# Patient Record
Sex: Male | Born: 1965 | Hispanic: Refuse to answer | Smoking: Former smoker
Health system: Southern US, Community
[De-identification: ages and names within clinical notes are randomized; demographics above are authoritative.]

## PROBLEM LIST (undated history)

## (undated) DIAGNOSIS — F419 Anxiety disorder, unspecified: Secondary | ICD-10-CM

## (undated) DIAGNOSIS — F32A Depression, unspecified: Secondary | ICD-10-CM

## (undated) DIAGNOSIS — K219 Gastro-esophageal reflux disease without esophagitis: Secondary | ICD-10-CM

## (undated) DIAGNOSIS — K635 Polyp of colon: Secondary | ICD-10-CM

## (undated) DIAGNOSIS — F329 Major depressive disorder, single episode, unspecified: Secondary | ICD-10-CM

## (undated) DIAGNOSIS — F1020 Alcohol dependence, uncomplicated: Secondary | ICD-10-CM

## (undated) DIAGNOSIS — I10 Essential (primary) hypertension: Secondary | ICD-10-CM

## (undated) DIAGNOSIS — R188 Other ascites: Secondary | ICD-10-CM

## (undated) DIAGNOSIS — K7460 Unspecified cirrhosis of liver: Secondary | ICD-10-CM

## (undated) HISTORY — DX: Polyp of colon: K63.5

## (undated) HISTORY — DX: Gastro-esophageal reflux disease without esophagitis: K21.9

## (undated) HISTORY — DX: Other ascites: R18.8

## (undated) HISTORY — DX: Alcohol dependence, uncomplicated: F10.20

## (undated) HISTORY — PX: WISDOM TOOTH EXTRACTION: SHX21

## (undated) HISTORY — DX: Unspecified cirrhosis of liver: K74.60

---

## 2001-01-27 ENCOUNTER — Other Ambulatory Visit: Admission: RE | Admit: 2001-01-27 | Discharge: 2001-01-27 | Payer: Self-pay | Admitting: General Surgery

## 2010-04-03 ENCOUNTER — Ambulatory Visit (HOSPITAL_COMMUNITY): Payer: Self-pay | Admitting: Psychiatry

## 2012-06-05 ENCOUNTER — Inpatient Hospital Stay (HOSPITAL_COMMUNITY)
Admission: AD | Admit: 2012-06-05 | Discharge: 2012-06-09 | DRG: 897 | Disposition: A | Discharge status: Home / Self Care | Payer: 59 | Source: Ambulatory Visit | Attending: Psychiatry | Admitting: Psychiatry

## 2012-06-05 ENCOUNTER — Encounter (HOSPITAL_COMMUNITY): Payer: Self-pay | Admitting: *Deleted

## 2012-06-05 ENCOUNTER — Emergency Department (HOSPITAL_COMMUNITY)
Admission: EM | Admit: 2012-06-05 | Discharge: 2012-06-05 | Disposition: A | Discharge status: Other Acute Inpatient Hospital | Payer: Managed Care, Other (non HMO) | Attending: Emergency Medicine | Admitting: Emergency Medicine

## 2012-06-05 DIAGNOSIS — F17200 Nicotine dependence, unspecified, uncomplicated: Secondary | ICD-10-CM | POA: Insufficient documentation

## 2012-06-05 DIAGNOSIS — F3289 Other specified depressive episodes: Secondary | ICD-10-CM | POA: Insufficient documentation

## 2012-06-05 DIAGNOSIS — F32A Depression, unspecified: Secondary | ICD-10-CM

## 2012-06-05 DIAGNOSIS — F1010 Alcohol abuse, uncomplicated: Secondary | ICD-10-CM | POA: Diagnosis present

## 2012-06-05 DIAGNOSIS — R45850 Homicidal ideations: Secondary | ICD-10-CM | POA: Insufficient documentation

## 2012-06-05 DIAGNOSIS — I10 Essential (primary) hypertension: Secondary | ICD-10-CM | POA: Diagnosis present

## 2012-06-05 DIAGNOSIS — R45851 Suicidal ideations: Secondary | ICD-10-CM | POA: Insufficient documentation

## 2012-06-05 DIAGNOSIS — F411 Generalized anxiety disorder: Secondary | ICD-10-CM | POA: Insufficient documentation

## 2012-06-05 DIAGNOSIS — F1410 Cocaine abuse, uncomplicated: Secondary | ICD-10-CM | POA: Diagnosis present

## 2012-06-05 DIAGNOSIS — F1020 Alcohol dependence, uncomplicated: Principal | ICD-10-CM | POA: Diagnosis present

## 2012-06-05 DIAGNOSIS — F329 Major depressive disorder, single episode, unspecified: Secondary | ICD-10-CM | POA: Insufficient documentation

## 2012-06-05 DIAGNOSIS — F1310 Sedative, hypnotic or anxiolytic abuse, uncomplicated: Secondary | ICD-10-CM | POA: Diagnosis present

## 2012-06-05 HISTORY — DX: Depression, unspecified: F32.A

## 2012-06-05 HISTORY — DX: Anxiety disorder, unspecified: F41.9

## 2012-06-05 HISTORY — DX: Essential (primary) hypertension: I10

## 2012-06-05 HISTORY — DX: Major depressive disorder, single episode, unspecified: F32.9

## 2012-06-05 LAB — RAPID URINE DRUG SCREEN, HOSP PERFORMED
Amphetamines: NOT DETECTED
Barbiturates: NOT DETECTED
Benzodiazepines: NOT DETECTED
Cocaine: POSITIVE — AB
Opiates: NOT DETECTED
Tetrahydrocannabinol: NOT DETECTED

## 2012-06-05 LAB — COMPREHENSIVE METABOLIC PANEL
ALT: 31 U/L (ref 0–53)
AST: 31 U/L (ref 0–37)
Albumin: 4.1 g/dL (ref 3.5–5.2)
Alkaline Phosphatase: 74 U/L (ref 39–117)
BUN: 4 mg/dL — ABNORMAL LOW (ref 6–23)
CO2: 24 mEq/L (ref 19–32)
Calcium: 9.1 mg/dL (ref 8.4–10.5)
Chloride: 102 mEq/L (ref 96–112)
Creatinine, Ser: 0.93 mg/dL (ref 0.50–1.35)
GFR calc Af Amer: >90 mL/min (ref >90)
GFR calc non Af Amer: >90 mL/min (ref >90)
Glucose, Bld: 114 mg/dL — ABNORMAL HIGH (ref 70–99)
Potassium: 3.8 mEq/L (ref 3.5–5.1)
Sodium: 139 mEq/L (ref 135–145)
Total Bilirubin: 0.4 mg/dL (ref 0.3–1.2)
Total Protein: 7.4 g/dL (ref 6.0–8.3)

## 2012-06-05 LAB — CBC
HCT: 53 % — ABNORMAL HIGH (ref 39.0–52.0)
Hemoglobin: 20 g/dL — ABNORMAL HIGH (ref 13.0–17.0)
MCH: 33.8 pg (ref 26.0–34.0)
MCHC: 37.7 g/dL — ABNORMAL HIGH (ref 30.0–36.0)
MCV: 89.7 fL (ref 78.0–100.0)
Platelets: 169 10*3/uL (ref 150–400)
RBC: 5.91 MIL/uL — ABNORMAL HIGH (ref 4.22–5.81)
RDW: 12.9 % (ref 11.5–15.5)
WBC: 7.1 10*3/uL (ref 4.0–10.5)

## 2012-06-05 LAB — URINALYSIS, ROUTINE W REFLEX MICROSCOPIC
Bilirubin Urine: NEGATIVE
Glucose, UA: NEGATIVE mg/dL
Hgb urine dipstick: NEGATIVE
Ketones, ur: NEGATIVE mg/dL
Leukocytes, UA: NEGATIVE
Nitrite: NEGATIVE
Protein, ur: NEGATIVE mg/dL
Specific Gravity, Urine: 1.005 (ref 1.005–1.030)
Urobilinogen, UA: 0.2 mg/dL (ref 0.0–1.0)
pH: 6 (ref 5.0–8.0)

## 2012-06-05 LAB — ETHANOL
Alcohol, Ethyl (B): 231 mg/dL — ABNORMAL HIGH (ref 0–11)
Alcohol, Ethyl (B): 77 mg/dL — ABNORMAL HIGH (ref 0–11)

## 2012-06-05 MED ORDER — VITAMIN B-1 100 MG PO TABS: 100.0000 mg | ORAL_TABLET | Freq: Every day | ORAL | Status: DC

## 2012-06-05 MED ORDER — CHLORDIAZEPOXIDE HCL 25 MG PO CAPS
25.0000 mg | ORAL_CAPSULE | Freq: Three times a day (TID) | ORAL | Status: AC
  Administered 2012-06-07 – 2012-06-08 (×4): 25 mg via ORAL
  Filled 2012-06-05 (×2): qty 1

## 2012-06-05 MED ORDER — LORAZEPAM 2 MG/ML IJ SOLN: 1.0000 mg | Freq: Four times a day (QID) | INTRAMUSCULAR | Status: DC | PRN

## 2012-06-05 MED ORDER — ONDANSETRON HCL 4 MG PO TABS: 4.0000 mg | ORAL_TABLET | Freq: Three times a day (TID) | ORAL | Status: DC | PRN

## 2012-06-05 MED ORDER — ACETAMINOPHEN 325 MG PO TABS: 650.0000 mg | ORAL_TABLET | Freq: Four times a day (QID) | ORAL | Status: DC | PRN

## 2012-06-05 MED ORDER — LORAZEPAM 1 MG PO TABS
2.0000 mg | ORAL_TABLET | Freq: Once | ORAL | Status: AC
  Administered 2012-06-05: 2 mg via ORAL
  Filled 2012-06-05: qty 2

## 2012-06-05 MED ORDER — ADULT MULTIVITAMIN W/MINERALS CH
1.0000 | ORAL_TABLET | Freq: Every day | ORAL | Status: DC
  Administered 2012-06-06 – 2012-06-09 (×4): 1 via ORAL
  Filled 2012-06-05 (×5): qty 1

## 2012-06-05 MED ORDER — CHLORDIAZEPOXIDE HCL 25 MG PO CAPS
25.0000 mg | ORAL_CAPSULE | ORAL | Status: DC
  Administered 2012-06-08: 25 mg via ORAL
  Filled 2012-06-05: qty 1

## 2012-06-05 MED ORDER — ALUM & MAG HYDROXIDE-SIMETH 200-200-20 MG/5ML PO SUSP: 30.0000 mL | ORAL | Status: DC | PRN

## 2012-06-05 MED ORDER — LORAZEPAM 1 MG PO TABS: 1.0000 mg | ORAL_TABLET | Freq: Four times a day (QID) | ORAL | Status: DC | PRN

## 2012-06-05 MED ORDER — ADULT MULTIVITAMIN W/MINERALS CH
1.0000 | ORAL_TABLET | Freq: Every day | ORAL | Status: DC
  Administered 2012-06-05: 1 via ORAL
  Filled 2012-06-05: qty 1

## 2012-06-05 MED ORDER — NICOTINE 21 MG/24HR TD PT24
21.0000 mg | MEDICATED_PATCH | Freq: Every day | TRANSDERMAL | Status: DC
  Administered 2012-06-05: 21 mg via TRANSDERMAL
  Filled 2012-06-05: qty 1

## 2012-06-05 MED ORDER — FOLIC ACID 1 MG PO TABS
1.0000 mg | ORAL_TABLET | Freq: Every day | ORAL | Status: DC
  Administered 2012-06-05: 1 mg via ORAL
  Filled 2012-06-05: qty 1

## 2012-06-05 MED ORDER — ATENOLOL 25 MG PO TABS
25.0000 mg | ORAL_TABLET | Freq: Every day | ORAL | Status: DC
  Administered 2012-06-05: 25 mg via ORAL
  Filled 2012-06-05: qty 1

## 2012-06-05 MED ORDER — THIAMINE HCL 100 MG/ML IJ SOLN: 100.0000 mg | Freq: Every day | INTRAMUSCULAR | Status: DC

## 2012-06-05 MED ORDER — VITAMIN B-1 100 MG PO TABS
100.0000 mg | ORAL_TABLET | Freq: Every day | ORAL | Status: DC
  Administered 2012-06-06 – 2012-06-09 (×4): 100 mg via ORAL
  Filled 2012-06-05 (×5): qty 1

## 2012-06-05 MED ORDER — IBUPROFEN 400 MG PO TABS: 600.0000 mg | ORAL_TABLET | Freq: Three times a day (TID) | ORAL | Status: DC | PRN

## 2012-06-05 MED ORDER — ADULT MULTIVITAMIN W/MINERALS CH: 1.0000 | ORAL_TABLET | Freq: Every day | ORAL | Status: DC

## 2012-06-05 MED ORDER — HYDROXYZINE HCL 25 MG PO TABS
25.0000 mg | ORAL_TABLET | Freq: Four times a day (QID) | ORAL | Status: DC | PRN
  Administered 2012-06-05 – 2012-06-07 (×3): 25 mg via ORAL

## 2012-06-05 MED ORDER — CHLORDIAZEPOXIDE HCL 25 MG PO CAPS
25.0000 mg | ORAL_CAPSULE | Freq: Four times a day (QID) | ORAL | Status: AC
  Administered 2012-06-05 – 2012-06-06 (×5): 25 mg via ORAL
  Filled 2012-06-05 (×5): qty 1

## 2012-06-05 MED ORDER — CHLORDIAZEPOXIDE HCL 25 MG PO CAPS: 25.0000 mg | ORAL_CAPSULE | Freq: Every day | ORAL | Status: DC

## 2012-06-05 MED ORDER — LOPERAMIDE HCL 2 MG PO CAPS: 2.0000 mg | ORAL_CAPSULE | ORAL | Status: AC | PRN

## 2012-06-05 MED ORDER — NICOTINE 21 MG/24HR TD PT24
21.0000 mg | MEDICATED_PATCH | Freq: Every day | TRANSDERMAL | Status: DC
  Administered 2012-06-06 – 2012-06-08 (×3): 21 mg via TRANSDERMAL
  Filled 2012-06-05 (×6): qty 1

## 2012-06-05 MED ORDER — ONDANSETRON 4 MG PO TBDP: 4.0000 mg | ORAL_TABLET | Freq: Four times a day (QID) | ORAL | Status: AC | PRN

## 2012-06-05 MED ORDER — THIAMINE HCL 100 MG/ML IJ SOLN
100.0000 mg | Freq: Once | INTRAMUSCULAR | Status: AC
  Administered 2012-06-05: 100 mg via INTRAMUSCULAR

## 2012-06-05 MED ORDER — CHLORDIAZEPOXIDE HCL 25 MG PO CAPS
25.0000 mg | ORAL_CAPSULE | Freq: Four times a day (QID) | ORAL | Status: DC | PRN
  Administered 2012-06-07: 25 mg via ORAL
  Filled 2012-06-05: qty 1

## 2012-06-05 MED ORDER — VITAMIN B-1 100 MG PO TABS
100.0000 mg | ORAL_TABLET | Freq: Every day | ORAL | Status: DC
  Administered 2012-06-05: 100 mg via ORAL
  Filled 2012-06-05: qty 1

## 2012-06-05 MED ORDER — ZOLPIDEM TARTRATE 5 MG PO TABS: 5.0000 mg | ORAL_TABLET | Freq: Every evening | ORAL | Status: DC | PRN

## 2012-06-05 MED ORDER — MAGNESIUM HYDROXIDE 400 MG/5ML PO SUSP: 30.0000 mL | Freq: Every day | ORAL | Status: DC | PRN

## 2012-06-05 MED ORDER — TRAZODONE HCL 100 MG PO TABS
100.0000 mg | ORAL_TABLET | Freq: Once | ORAL | Status: AC
  Administered 2012-06-05: 100 mg via ORAL
  Filled 2012-06-05 (×2): qty 1

## 2012-06-05 MED ORDER — FOLIC ACID 1 MG PO TABS: 1.0000 mg | ORAL_TABLET | Freq: Every day | ORAL | Status: DC

## 2012-06-05 NOTE — ED Notes (Signed)
Report given to [facility] 

## 2012-06-05 NOTE — ED Notes (Signed)
Pt has been accepted to [facility] bed 508-01

## 2012-06-05 NOTE — ED Notes (Signed)
Pt in scrubs, wanded and sitter at bedside. nad

## 2012-06-05 NOTE — ED Provider Notes (Signed)
History   This chart was scribed for Raeford Razor, MD by Toya Smothers. The patient was seen in room APA17/APA17. Patient's care was started at 0659.  CSN: [id number]  Arrival date & time 06/05/12  4098   None     Chief Complaint  Patient presents with  . V70.1   The history is provided by the patient. No language interpreter was used.    Adam Hartman is a 46 y.o. male with a h/o Alcoholism, Sleep apnea, Panic Attacks, and Depression who presents to the ED complaining of suicidal and homicidal ideations onset 3 days ago. Ideations represent a drastic change from baseline behavior. Homicidal ideation pertain to killing Pt's ex-girlfriend, though Pt is reluctant to elaborate. Pt recently lost his job and his father has died. Prior to arrival to the ED Pt's last drink was a few hours ago. Pt denotes panic attacks when not using alcohol, and reports a dependency on unprescribed Xanax when not drinking. Denies hallucinations and delusions, and is guarded in disclosure of present illness.   Past Medical History  Diagnosis Date  . Hypertension   . Anxiety   . Depression     History reviewed. No pertinent past surgical history.  No family history on file.  History  Substance Use Topics  . Smoking status: Current Everyday Smoker    Types: Cigarettes  . Smokeless tobacco: Not on file  . Alcohol Use: 0.0 oz/week    18-19 Cans of beer per week     Review of Systems  Psychiatric/Behavioral: Positive for suicidal ideas and disturbed wake/sleep cycle. Negative for hallucinations.       Homicidal Ideations, Depression   All other systems reviewed and are negative.   Allergies  Codeine  Home Medications   Current Outpatient Rx  Name Route Sig Dispense Refill  . ATENOLOL 25 MG PO TABS Oral Take 25 mg by mouth daily.      BP 134/87  Pulse 76  Temp 97.9 F (36.6 C) (Oral)  Resp 18  Ht 6' (1.829 m)  Wt 240 lb (108.863 kg)  BMI 32.55 kg/m2  SpO2 100%  Physical Exam    Nursing note and vitals reviewed. Constitutional: He is oriented to person, place, and time. He appears well-developed and well-nourished. No distress.       Flat affect. Poor eye contact.  HENT:  Head: Atraumatic.  Mouth/Throat: No oropharyngeal exudate.  Eyes: EOM are normal. Pupils are equal, round, and reactive to light. Right eye exhibits no discharge. Left eye exhibits no discharge.  Neck: Neck supple.  Cardiovascular: Normal rate and regular rhythm.   No murmur heard. Pulmonary/Chest: Effort normal. No stridor. No respiratory distress. He has no rales. He exhibits no tenderness.       Slight wheezes bilaterally.  Abdominal: Soft. Bowel sounds are normal. He exhibits no mass. There is no tenderness. There is no rebound.  Musculoskeletal: He exhibits no tenderness.       Baseline ROM, moves extremities with no obvious new focal weakness.  Lymphadenopathy:    He has no cervical adenopathy.  Neurological: He is alert and oriented to person, place, and time. No cranial nerve deficit. Coordination normal.  Skin: No rash noted. He is not diaphoretic.  Psychiatric: He has a normal mood and affect.    ED Course  Procedures (including critical care time) DIAGNOSTIC STUDIES: Oxygen Saturation is 100% on room air, normal by my interpretation.    COORDINATION OF CARE: 0727- Evaluated Pt. Pt is with acute distress,  awake, alert, and oriented. 35- Ordered CBC STAT. 0981- Ordered ED EKG Once. 1914- Ordered Comprehensive metabolic panel STAT. [id number]- Ordered Ethanol STAT. [id number]- Ordered Urinalysis, Routine w reflex microscopic Once . 7829- Ordered Drug screen panel, emergency STAT. [id number] Ordered Full code Continuous. 3086- Ordered ondansetron (ZOFRAN) tablet 4 mg Every 8 hours PRN. 5784- Ordered zolpidem (AMBIEN) tablet 5 mg At bedtime PRN. 6962- Ordered ibuprofen (ADVIL,MOTRIN) tablet 600 mg Every 8 hours. 9528- Ordered LORazepam (ATIVAN) injection 1 mg Every 6 hours PRN. 4132-  Ordered LORazepam (ATIVAN) tablet 1 mg Every 6 hours PRN.  4401- Ordered Clinical institute withdrawal assessment every 6 hours X 48 hours, then every 12 hours x 48 hours Until discontinued. 38- Ordered Vital signs every 6 hours X 48 hours, then per unit protocol Until discontinued. 0272- Psychiatry Ordered.   Labs Reviewed  CBC - Abnormal; Notable for the following:    RBC 5.91 (*)     Hemoglobin 20.0 (*)     HCT 53.0 (*)     MCHC 37.7 (*)     All other components within normal limits  COMPREHENSIVE METABOLIC PANEL - Abnormal; Notable for the following:    Glucose, Bld 114 (*)     BUN 4 (*)     All other components within normal limits  ETHANOL - Abnormal; Notable for the following:    Alcohol, Ethyl (B) 231 (*)     All other components within normal limits  URINALYSIS, ROUTINE W REFLEX MICROSCOPIC  URINE RAPID DRUG SCREEN (HOSP PERFORMED)    No results found.  EKG:  Rhythm: normal sinus Vent. rate 68 BPM PR interval 184 ms QRS duration 92 ms QT/QTc 398/423 ms P-R-T axes 54 17 19 Intervals/conduction: normal ST segments: inverted t waves in III   1. Alcohol abuse   2. Depression   3. Suicidal ideation       MDM  46yM with depression, etoh abuse and SI. Pt voluntary. No evidence of psychosis. Discussed with ACT who will eval pt in ED.   I personally preformed the services scribed in my presence. The recorded information has been reviewed and considered. Raeford Razor, MD.      Raeford Razor, MD 06/05/12 1041
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
## 2012-06-05 NOTE — ED Notes (Signed)
Per Ala Dach at [facility] - pt has been accepted but no beds available today.  Possibly tomorrow.  Pt notified and verbalized understanding.  Sitter at bedside.
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
## 2012-06-05 NOTE — ED Notes (Signed)
SI x 3 days.  Reports loss of job and girlfriend.  Admits to plan but refuses to tell RN what plan would be.  Pt states, "Yes I've planned it out but if I wanted to do it I would have done it by now."  Denies hallucinations/delusions.  Admits drinking ETOH last night, reports drinking on a daily basis.  Admits to HI toward his girlfriend.  Calm, cooperative at this time.

## 2012-06-05 NOTE — ED Notes (Signed)
Per Patsy Lager with ACT - pt has been accepted to [facility] pending ETOH below 200.  Will call [facility] for bed assignment once lab results are back.

## 2012-06-05 NOTE — BHH Counselor (Signed)
[facility] authorization given by Nelida Meuse. For 3 days; Auth # [id number], 8/23-8/25, review on 06/08/12.

## 2012-06-05 NOTE — Progress Notes (Signed)
Psychoeducational Group Note  Date:  06/05/2012 Time:  2000  Group Topic/Focus:  Wrap-Up Group:   The focus of this group is to help patients review their daily goal of treatment and discuss progress on daily workbooks.  Participation Level: Did Not Attend  Participation Quality:  Not Applicable  Affect:  Not Applicable  Cognitive:  Not Applicable  Insight:  Not Applicable  Engagement in Group: Not Applicable  Additional Comments:  The pt. was admitted after the group was held.   Hazle Coca S 06/05/2012, 10:37 PM

## 2012-06-05 NOTE — BHH Counselor (Signed)
Ardelia Mems, ACT nurse at [facility], submitted Pt for admission to [facility]. Consulted with Theodoro Kos, [facility] who said an appropriate bed is not currently available. Dr. Harvie Heck Readling reviewed clinical information and accepted Pt to the services of Dr. Antonietta Breach when a 300 hall bed is available and when BAL<200.  Harlin Rain Patsy [place], LPC

## 2012-06-06 DIAGNOSIS — F1020 Alcohol dependence, uncomplicated: Secondary | ICD-10-CM

## 2012-06-06 DIAGNOSIS — F1010 Alcohol abuse, uncomplicated: Secondary | ICD-10-CM | POA: Diagnosis present

## 2012-06-06 HISTORY — DX: Alcohol dependence, uncomplicated: F10.20

## 2012-06-06 NOTE — Progress Notes (Signed)
BHH Group Notes:  (Counselor/Nursing/MHT/Case Management/Adjunct)  06/06/2012 10:27 AM  Type of Therapy:  After care Planning Group  Pt. participated in  After care planning group and was given [facility] Suicide Prevention information along with crisis numbers and agreed to use them if needed. The pt. Stated that he was okay and that he came to [facility] with problems with alcohol. Pt. Denied SI/Hi today. Pt. Stated he was transferred form [facility]  And has not seen a doctor yet. Pt. was told he would see a doctor today.  Lamar Blinks [place]dal 06/06/2012, 10:27 AM

## 2012-06-06 NOTE — Progress Notes (Signed)
[facility] Group Notes:  (Counselor/Nursing/MHT/Case Management/Adjunct)  06/06/2012 4:16 PM  Type of Therapy:  Group Therapy  Participation Level:  Active  Participation Quality:  Appropriate  Affect:  Appropriate  Cognitive:  Appropriate  Insight:  Good  Engagement in Group:  Good  Engagement in Therapy:  Good  Modes of Intervention:  Clarification, Education, Socialization and Support  Summary of Progress/Problems: The pt. participated in group discussion on self sabotaging behaviors and how to recognize when they are doing these behaviors and how the can stop and make the changes they need to make in order to stop doing self sabotaging behaviors again.  The pt. Spoke about his self sabotaging behavior being that when things are going good(used example of girlfriend), and how he will do things like start a fight so that the relationship can end. Pt. Made the statement that he knew when "exactly" he was doing some things that was self sabotaging to him.  Adam Hartman [place] 06/06/2012, 4:16 PM

## 2012-06-06 NOTE — Progress Notes (Signed)
Patient ID: Adam Hartman, male   DOB: 11-04-1965, 46 y.o.   MRN: [id number]  Problem: Depression, ETOH Abuse  D: Pt admitted to unit, endorses depression with dull, flat affect.  A: Monitor patient Q 15 minutes for safety, encourage staff/peer interaction, administer medications as ordered by MD.  R: Pt arrived too late to attend groups, but was compliant with HS medications. No inappropriate behaviors noted. Pt contracts for safety verbally.

## 2012-06-06 NOTE — BHH Suicide Risk Assessment (Signed)
Suicide Risk Assessment  Admission Assessment     Demographic factors:  Assessment Details Time of Assessment: Admission Information Obtained From: Patient Current Mental Status:  Current Mental Status: Suicidal ideation indicated by patient;Suicide plan Loss Factors:  Loss Factors: Loss of significant relationship;Financial problems / change in socioeconomic status Historical Factors:    Risk Reduction Factors:  Risk Reduction Factors: Positive social support  CLINICAL FACTORS:   Depression:   Anhedonia Hopelessness Impulsivity Insomnia Alcohol/Substance Abuse/Dependencies  COGNITIVE FEATURES THAT CONTRIBUTE TO RISK:  Loss of executive function Polarized thinking    SUICIDE RISK:  Minimal: No identifiable suicidal ideation.  Patients presenting with no risk factors but with morbid ruminations; may be classified as minimal risk based on the severity of the depressive symptoms  PLAN OF CARE: Patient was admitted to the [facility], adult psychiatric unit for treatment of depression, and alcohol detox treatment. Patient will be receiving Librium protocol and other medication as needed.   Douglass Dunshee,JANARDHAHA R. 06/06/2012, 5:54 PM

## 2012-06-06 NOTE — Progress Notes (Signed)
D) Pt has attended the program today and participates.Rates his depression at a 2 and his hopelessness at a 1. Denies SI and HI. States that he is thinking long and hard about changing his life. Had a good job and took [employer] for everything and ran out. He was not informed that he had run out of time and he was let go because he went over his limit. States that he had originally gotten FMLA for his father who is sick and has cancer "all over". States he spent very little of the FMLA on his father and would go to his cousins house and sit there and drink. In the last couple of months Pt had worked himself up to a 12 pack of beer daily. Also lost his girlfriend, whom he says he loves and wants to get her back. "I signed myself in here so I could stop drinking". A) Provided with a 1:1. Given support, reassurance and praise. Encouraged and educated to attend the 300 hall groups. AA explained to Pt so that he can get some numbers and names when he leaves here. R) Denies SI and HI. States he wants very much to get his life back.
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
## 2012-06-07 DIAGNOSIS — F1994 Other psychoactive substance use, unspecified with psychoactive substance-induced mood disorder: Secondary | ICD-10-CM

## 2012-06-07 DIAGNOSIS — F1010 Alcohol abuse, uncomplicated: Secondary | ICD-10-CM

## 2012-06-07 NOTE — Progress Notes (Signed)
Met with pt 1:1 who is somewhat guarded. He denies any w/d symptoms however is reddened in the face and sweating observed. BP also elevated. He denies any SI/HI/AVH. Anxious affect with congruent mood. Support given, medicated with scheduled librium and vistaril prn for sleep which was effective. Pt remains safe, currently asleep. Adam Hartman

## 2012-06-07 NOTE — Progress Notes (Signed)
Progress note was performed by physician extender and as a supervising MD, available for assistance near by during this rounds. Agree with the findings.   
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
## 2012-06-07 NOTE — H&P (Signed)
Progress note was performed by physician extender and as a supervising MD, available for assistance near by during this rounds.  

## 2012-06-07 NOTE — Progress Notes (Signed)
D.  Pt pleasant on approach, feeling some anxiety.  Positive for AA group this evening.  Requested medication for anxiety and to help him sleep.  No further complaints noted.  Denies SI/HI/hallucinations at this time.  A.  Support and encouragement offered.  Prn medication given as ordered and appropriate.  R.  No acute distress noted, will continue to monitor.

## 2012-06-07 NOTE — Progress Notes (Signed)
The patient attended the A. A. Meeting on the 300 hallway. 

## 2012-06-07 NOTE — Progress Notes (Signed)
[facility] MD Progress Note  06/07/2012 3:23 PM  S: "I am kind of depressed and worried. Feeling a lot of guilt. I do not need depression medicines. Trying to reflect on things. No suicidal thoughts or thoughts of hurting anyone else. The withdrawal symptoms, not so bad."  Diagnosis:   Axis I: Substance Induced Mood Disorder and Alcohol abuse, continuous Axis II: Deferred Axis III:  Past Medical History  Diagnosis Date  . Hypertension   . Anxiety   . Depression    Axis IV: other psychosocial or environmental problems Axis V: 51-60 moderate symptoms  ADL's:  Intact  Sleep: Good  Appetite:  Good  Suicidal Ideation: "No" Plan:  No Intent:  no Means:  no Homicidal Ideation:  Plan:  No Intent:  no Means:  no  AEB (as evidenced by): per patient's reports.  Mental Status Examination/Evaluation: Objective:  Appearance: Casual  Eye Contact::  Good  Speech:  Clear and Coherent  Volume:  Normal  Mood:  Depressed, guilt  Affect:  Flat  Thought Process:  Coherent  Orientation:  Full  Thought Content:  Rumination  Suicidal Thoughts:  No  Homicidal Thoughts:  No  Memory:  Immediate;   Good Recent;   Good Remote;   Good  Judgement:  Good  Insight:  Good  Psychomotor Activity:  Normal  Concentration:  Good  Recall:  Good  Akathisia:  No  Handed:  Right  AIMS (if indicated):     Assets:  Desire for Improvement  Sleep:  Number of Hours: 6.75    Vital Signs:Blood pressure 141/87, pulse 80, temperature 97.1 F (36.2 C), temperature source Oral, resp. rate 20, height 6' (1.829 m), weight 117.028 kg (258 lb). Current Medications: Current Facility-Administered Medications  Medication Dose Route Frequency Provider Last Rate Last Dose  . acetaminophen (TYLENOL) tablet 650 mg  650 mg Oral Q6H PRN Nehemiah Settle, MD      . alum & mag hydroxide-simeth (MAALOX/MYLANTA) 200-200-20 MG/5ML suspension 30 mL  30 mL Oral Q4H PRN Nehemiah Settle, MD      .  chlordiazePOXIDE (LIBRIUM) capsule 25 mg  25 mg Oral Q6H PRN Nehemiah Settle, MD      . chlordiazePOXIDE (LIBRIUM) capsule 25 mg  25 mg Oral QID Nehemiah Settle, MD   25 mg at 06/06/12 2134   Followed by  . chlordiazePOXIDE (LIBRIUM) capsule 25 mg  25 mg Oral TID Nehemiah Settle, MD   25 mg at 06/07/12 0940   Followed by  . chlordiazePOXIDE (LIBRIUM) capsule 25 mg  25 mg Oral BH-qamhs Nehemiah Settle, MD       Followed by  . chlordiazePOXIDE (LIBRIUM) capsule 25 mg  25 mg Oral Daily Nehemiah Settle, MD      . hydrOXYzine (ATARAX/VISTARIL) tablet 25 mg  25 mg Oral Q6H PRN Nehemiah Settle, MD   25 mg at 06/06/12 2134  . loperamide (IMODIUM) capsule 2-4 mg  2-4 mg Oral PRN Nehemiah Settle, MD      . magnesium hydroxide (MILK OF MAGNESIA) suspension 30 mL  30 mL Oral Daily PRN Nehemiah Settle, MD      . multivitamin with minerals tablet 1 tablet  1 tablet Oral Daily Nehemiah Settle, MD   1 tablet at 06/07/12 0939  . nicotine (NICODERM CQ - dosed in mg/24 hours) patch 21 mg  21 mg Transdermal Q0600 Nehemiah Settle, MD   21 mg at 06/07/12 0630  . ondansetron (ZOFRAN-ODT)  disintegrating tablet 4 mg  4 mg Oral Q6H PRN Nehemiah Settle, MD      . thiamine (VITAMIN B-1) tablet 100 mg  100 mg Oral Daily Nehemiah Settle, MD   100 mg at 06/07/12 1610    Lab Results: No results found for this or any previous visit (from the past 48 hour(s)).  Physical Findings: AIMS: Facial and Oral Movements Muscles of Facial Expression: None, normal Lips and Perioral Area: None, normal Jaw: None, normal Tongue: None, normal,Extremity Movements Upper (arms, wrists, hands, fingers): None, normal Lower (legs, knees, ankles, toes): None, normal, Trunk Movements Neck, shoulders, hips: None, normal, Overall Severity Severity of abnormal movements (highest score from questions above): None,  normal Incapacitation due to abnormal movements: None, normal Patient's awareness of abnormal movements (rate only patient's report): No Awareness, Dental Status Current problems with teeth and/or dentures?: No Does patient usually wear dentures?: No  CIWA:  CIWA-Ar Total: 5  COWS:  COWS Total Score: 0   Treatment Plan Summary: Daily contact with patient to assess and evaluate symptoms and progress in treatment Medication management  Plan: Continue to decline to take an antidepressant, although continue to report  Increased depression. Continue patient on detox protocol.  Armandina Stammer I 06/07/2012, 3:23 PM

## 2012-06-07 NOTE — Progress Notes (Signed)
D) Pt being transferred to the 300 Hall to attend and be part of the detox program.  Rates his depression and hopelessness 1. Denies SI and HI. Has slept much of the morning. Affect is flat and mood depressed. States that he is feelng better overall, but his words are incongruent with his affect and mood.  A) Given support and reassurance. Encouraged to attend the program on the 300 hall and not to sleep. R) Denies SI and HI.

## 2012-06-07 NOTE — Progress Notes (Signed)
Patient ID: Adam Hartman, male   DOB: [birth year], 46 y.o.   MRN: [id number]   [facility] Group Notes:  (Counselor/Nursing/MHT/Case Management/Adjunct)  06/07/2012 1:15 PM  Type of Therapy:  Group Therapy, Dance/Movement Therapy   Participation Level:  Did Not Attend.  Pt. Did not attend counseling group.    Gevena Mart 06/07/2012. 2:54 PM

## 2012-06-07 NOTE — Progress Notes (Signed)
Patient ID: Adam Hartman, male   DOB: 1966-05-29, 46 y.o.   MRN: [id number] D: Pt is awake in bed this AM. Pt denies SI/HI and A/V hallucinations. Pt is not participating in the milieu but is relatively cooperative with staff. Pt rates their depression at 1 and hopelessness at 1. Pt's most recent CIWA score was 5. However, pt mood is extremely depressed and he expresses shame related to not spending time with his father while he is in the hospital with cancer. Pt wants to be alone and is ruminating. Pt writes that he intends to attend AA and counseling after d/c.   A: Writer offered self, utilized therapeutic communication and administered medication per MD orders. Writer also encouraged pt to discuss feelings with staff and attend groups. Pt states that "it is really  getting to me today."   R: Pt is not attending groups but is tolerating medications well. Writer will continue to monitor. 15 minute checks are ongoing for safety.

## 2012-06-07 NOTE — Progress Notes (Signed)
Psychoeducational Group Note  Date:  06/07/2012 Time:  1515  Group Topic/Focus:  Making Healthy Choices:   The focus of this group is to help patients identify negative/unhealthy choices they were using prior to admission and identify positive/healthier coping strategies to replace them upon discharge.  Participation Level:  Did Not Attend  Dalia Heading 06/07/2012, 4:50 PM

## 2012-06-08 LAB — CBC
HCT: 50 % (ref 39.0–52.0)
Hemoglobin: 17.7 g/dL — ABNORMAL HIGH (ref 13.0–17.0)
MCV: 89.3 fL (ref 78.0–100.0)
RDW: 12.8 % (ref 11.5–15.5)
WBC: 7.5 10*3/uL (ref 4.0–10.5)

## 2012-06-08 MED ORDER — SERTRALINE HCL 50 MG PO TABS
ORAL_TABLET | ORAL | Status: AC
  Filled 2012-06-08: qty 1

## 2012-06-08 MED ORDER — TRAZODONE HCL 50 MG PO TABS: 50.0000 mg | ORAL_TABLET | Freq: Every evening | ORAL | Status: DC | PRN

## 2012-06-08 NOTE — Progress Notes (Signed)
Patient did attend the evening speaker AA meeting.  

## 2012-06-08 NOTE — Progress Notes (Signed)
[facility] Group Notes:  (Counselor/Nursing/MHT/Case Management/Adjunct)   Type of Therapy:  Group Therapy at 1:15 to 2:30 PM  Participation Level:  Did Not Attend  Adam Hartman 06/08/2012, 2:22 PM

## 2012-06-08 NOTE — Progress Notes (Signed)
Pt attended discharge planning group and actively participated in group.  SW provided pt with today's workbook.  Pt presents with calm mood and affect.  Pt was open with sharing reason for entering the hospital.  Pt states that he was drinking too much and it got out of hand.  Pt states that he came to detox and doesn't feel he needs further treatment after detox.  Pt is open to going to outpatient and AA meetings.  Pt states that he is worried about his dad, stating that he is in the hospital now dying.  Pt states that he plans to live with his mother after d/c and has transportation home.  SW will refer pt to [facility] for medication management and therapy.  SW will provide pt with a list of AA meetings in [place].  Pt denies having depression, anxiety and SI/HI.  No further needs voiced by pt at this time.    Adam Hartman, LCSWA 06/08/2012  1:41 PM

## 2012-06-08 NOTE — Progress Notes (Signed)
Psychoeducational Group Note  Date:  06/08/2012 Time:  1100  Group Topic/Focus:  Wellness Toolbox:   The focus of this group is to discuss various aspects of wellness, balancing those aspects and exploring ways to increase the ability to experience wellness.  Patients will create a wellness toolbox for use upon discharge.  Participation Level: Did Not Attend  Participation Quality:  Not Applicable  Affect:  Not Applicable  Cognitive:  Not Applicable  Insight:  Not Applicable  Engagement in Group: Not Applicable  Additional Comments:  Pt did not attend group but remained in room during group.   Koleson Reifsteck Gershon 06/08/2012, 1:26 PM 

## 2012-06-08 NOTE — Progress Notes (Signed)
Psychoeducational Group Note  Date:  06/08/2012 Time:  1000  Group Topic/Focus:  Therapeutic Activity: Human Bingo  Participation Level:  Active  Participation Quality:  Appropriate and Attentive  Affect:  Appropriate  Cognitive:  Appropriate  Insight:  Good  Engagement in Group:  Good  Additional Comments: Pt participated in group of Human Bingo. Pt was asked question about other patients and was given the opportunity to ask peers if it applied to them. An example may be "someone in the room wearing blue." Pt stated at the end of group, they learned information about peers they had no knowledge of before. Pt was able to interact with peers and share information that was therapeutic to learning facts about others.   Karleen Hampshire Brittini 06/08/2012, 11:10 AM

## 2012-06-08 NOTE — Progress Notes (Signed)
Psychoeducational Group Note  Date:  06/08/2012 Time: 2000  Group Topic/Focus:  Wrap-Up Group:   The focus of this group is to help patients review their daily goal of treatment and discuss progress on daily workbooks.  Participation Level:  Active  Participation Quality:  Drowsy  Affect:    Cognitive:    Insight:    Engagement in Group:    Additional Comments: Patient did attend AA meeting tonight.  Amani Marseille, Newton Pigg 06/08/2012, 9:22 PM

## 2012-06-08 NOTE — [facility] Suicide Risk Assessment (Addendum)
Suicide Risk Assessment  Discharge Assessment      Demographic factors:  Male;Divorced or widowed;Caucasian Unemployed  Current Mental Status Per Nursing Assessment: On Admission:  Suicidal ideation indicated by patient;Suicide plan At Discharge: Pt denied any SI/HI/thoughts of self harm or acute psychiatric issues in treatment team with clinical, nursing and medical team present.  Current Mental Status Per Physician: Denied any current w/d s/s. Patient seen and evaluated. Chart reviewed. Patient stated that his mood was "good". His affect was mood congruent and euthymic. He denied any current thoughts of self injurious behavior, suicidal ideation or homicidal ideation. He denied any significant depressive signs or symptoms at this time. There were no auditory or visual hallucinations, paranoia, delusional thought processes, or mania noted.  Thought process was linear and goal directed.  No psychomotor agitation or retardation was noted. His speech was normal rate, tone and volume. Eye contact was good. Judgment and insight are fair.  Patient has been up and engaged on the unit.  No safety concerns reported from team.  Requesting discharge in am and d/c of librium taper.    Loss Factors: Loss of significant relationship;Financial problems / change in socioeconomic status  Historical Factors: father ill; hospice called  Risk Reduction Factors: Positive social support; going to stay with mother; AA; f/u with [facility]; interested in counseling s/p discharge   Discharge Diagnoses: Alcohol Use Disorder; Stimulant Use Disorder, mild (Cocaine); Acute Grief   Past Medical History  Diagnosis Date  . Hypertension   . Anxiety   . Depression     Cognitive Features That Contribute To Risk: none.  Suicide Risk: Pt viewed as a chronic increased risk of harm to self in light of his past hx and risk factors.  No acute safety concerns on the unit.  Pt contracting for safety and is stable for discharge  in am s/p final review by treatment team.  Plan Of Care/Follow-up recommendations: Pt seen and evaluated in treatment team today. Chart reviewed.  Pt stable for and requesting discharge. Pt contracting for safety and does not currently meet [place] involuntary commitment criteria for continued hospitalization against his will.  Mental health treatment, medication management and continued sobriety will mitigate against the potential increased risk of harm to self and/or others.  Discussed the importance of recovery further with pt, as well as, tools to move forward in a healthy & safe manner.  Pt agreeable with the plan.  Discussed with the team.  Please see orders, follow up appointments per AVS and full discharge summary.  Recommend follow up with AA/NA.  Diet: Heart Healthy.  Activity: As tolerated.     Lupe Carney 06/08/2012, 4:53 PM  Addendum: 06/10/11 1212  Pt seen and evaluated in treatment team one last time together for discharge visit.  SRA completed late yesterday by this Clinical research associate for early am discharge today.  No change in mental status noted.  Pt still stable for discharge.  Agreed upon by team and pt.  Chart reviewed.

## 2012-06-08 NOTE — Progress Notes (Signed)
**Note Adam-Identified via Obfuscation** Interdisciplinary Treatment Plan Update (Adult)  Date: 06/08/2012  Time Reviewed: 11:26 AM   Progress in Treatment: Attending groups: Yes Participating in groups: Yes Taking medication as prescribed:  Yes Tolerating medication:  Yes Family/Significant othe contact made: Not as yet Patient understands diagnosis: Yes  As evidenced by asking for help with detox Discussing patient identified problems/goals with staff: See below Medical problems stabilized or resolved:  Yes Denies suicidal/homicidal ideation: Yes In tx team Issues/concerns per patient self-inventory:  None noted Other:  New problem(s) identified:    Reason for Continuation of Hospitalization: Medication stabilization   Interventions implemented related to continuation of hospitalization: Discontinue librium taper to make sure Ezeriah is OK, Suicide prevention education, Encourage group attendance and participation  Dr offered Gerlene Burdock anti-depressant but he denied depression  Additional comments:  Estimated length of stay: 1  Discharge Plan: Go home, followup at [facility] and attend AA  New goal(s):  Review of initial/current patient goals per problem list:   1.  Goal(s):Safely detox from alcohol  Met:  No  Target date:8/27  As evidenced NW:GNFAOZ vitals, no withdrawal symptoms  2.  Goal (s): Eliminate SI  Met:  Yes  Target date:8/26  As evidenced HY:QMVH report in tx team  3.  Goal(s): Identify comprehensive sobriety plan  Met:  Yes  Target date:8/26  As evidenced QI:ONGEXBM states he will stay with his mother and attend regular AA mtgs  4.  Goal(s):  Met:  Yes  Target date:  As evidenced by:  Attendees: Patient:  Adam Hartman   Family:     Physician:  Barkley Boards [facility] 06/08/2012 11:26 AM   Nursing:   Alease Frame, RN 06/08/2012 11:26 AM   Case Manager:  Richelle Ito, LCSW 06/08/2012 11:26 AM   Counselor:  Ronda Fairly, LCSWA  06/08/2012 11:26 AM   Other:   06/08/2012 11:26 AM     Other:   06/08/2012 11:26 AM   Other:   06/08/2012 11:26 AM   Other:   06/08/2012 11:26 AM    Scribe for Treatment Team:

## 2012-06-08 NOTE — Progress Notes (Addendum)
D: Resting quietly in bed with eyes closed on approach. Opened eyes spontaneously to name. Appears flat and depressed but does brighten in conversation. Calm and cooperative with assessment. No acute distress noted. States he has had a good day. When asked what made the day good, he replied, "I found out I may get to go home tomorrow.". When asked if he felt like he is ready, he states he fells like he is. States his plan is to f/u with [facility] and attend AA meetings. He denies any WD symptoms. Denies SI/HI/AVH and contracts for safety. Offers no additional questions or concerns.   A: Safety has been maintained with Q15 min observation. Support and encouragement provided. POC and medications for the shift reviewed and understanding verbalized. Meds given as ordered by MD.   R: Pt remains safe with Q15 minute observation. He is calm and pleasant and does not appear to be suffering from any WD symptoms. He is complaint with medications and programming. He offers no additional questions or concerns. Will continue Q15 minute observation and continue current POC.

## 2012-06-08 NOTE — Progress Notes (Signed)
D patient did not fill out self inventory sheet, mood is good, affect blunt, denies Si or Hi, depression s/s denies today, AV/H denies, eye contact good judgment is fair today, up on unit attending group, asked to have meds dc/d for detox, patient wanting to discharge in the morning to go be w/mother and then his own apartment. Taking meds as ordered by MD, A q36min safety checks continue and support offered, attending group and participating and encouraged to continue attending R patient remains safe on the unit

## 2012-06-09 MED ORDER — ATENOLOL 25 MG PO TABS: 25.0000 mg | ORAL_TABLET | Freq: Every day | ORAL | Status: DC

## 2012-06-09 MED ORDER — TRAZODONE HCL 50 MG PO TABS: 50.0000 mg | ORAL_TABLET | Freq: Every evening | ORAL | Status: DC | PRN

## 2012-06-09 NOTE — Progress Notes (Signed)
Psychoeducational Group Note  Date:  06/09/2012 Time: 1100  Group Topic/Focus:  Recovery Goals:   The focus of this group is to identify appropriate goals for recovery and establish a plan to achieve them.  Participation Level:  Active  Participation Quality:  Appropriate and Attentive  Affect:  Appropriate  Cognitive:  Appropriate  Insight:  Good  Engagement in Group:  Good  Additional Comments:  Pt participated in recovery goals group. Pt defined what recovery is. Pt also was given a personal recovery goals worksheet. Pt worked on the worksheet and wrote what changes needed to be made and set a goal toward the change. Pt was asked to make specific, measurable goals that would be realistic to accomplish. Pt shared and was cooperative during group.  Karleen Hampshire Brittini 06/09/2012, 1:13 PM

## 2012-06-09 NOTE — Tx Team (Signed)
Interdisciplinary Treatment Plan Update (Adult)  Date:  06/09/2012  Time Reviewed:  10:26 AM   Progress in Treatment: Attending groups: Yes Participating in groups:  Yes Taking medication as prescribed: Yes Tolerating medication:  Yes Family/Significant othe contact made:  No Patient understands diagnosis:  Yes Discussing patient identified problems/goals with staff:  Yes Medical problems stabilized or resolved:  Yes Denies suicidal/homicidal ideation: Yes Issues/concerns per patient self-inventory:  None identified Other: N/A  New problem(s) identified: None Identified  Reason for Continuation of Hospitalization: Stable to d/c  Interventions implemented related to continuation of hospitalization: Stable to d/c  Additional comments: N/A  Estimated length of stay: D/C today  Discharge Plan: Pt will follow up at [facility] for medication management and therapy and AA meetings.    New goal(s): N/A  Review of initial/current patient goals per problem list:    1. Goal(s):Safely detox from alcohol  Met: Yes Target date:8/27  As evidenced ZO:XWRUEA vitals, no withdrawal symptoms  Attendees: Patient:  Adam Hartman 06/09/2012 10:26 AM   Family:     Physician:  Lupe Carney, DO 06/09/2012 10:26 AM   Nursing: Roswell Miners, RN 06/09/2012 10:26 AM   Case Manager:  Reyes Ivan, LCSWA 06/09/2012 10:26 AM   Counselor:  Ronda Fairly, LCSWA 06/09/2012 10:26 AM   Other:  Richelle Ito, LCSW  06/09/2012 10:26 AM   Other:  Robbie Louis, RN 06/09/2012 10:26 AM   Other:     Other:      Scribe for Treatment Team:   Reyes Ivan 06/09/2012 10:26 AM

## 2012-06-09 NOTE — Progress Notes (Signed)
[facility] Case Management Discharge Plan:  Will you be returning to the same living situation after discharge: Yes,  returning home At discharge, do you have transportation home?:Yes,  family will transport pt home Do you have the ability to pay for your medications:Yes,  access to meds  Release of information consent forms completed and in the chart;  Patient's signature needed at discharge.  Patient to Follow up at:  Follow-up Information    Follow up with [facility] on 06/11/2012. (Appointment scheduled at 7:45 am, referral # [id number])    Contact information:   [address] [place], [place] [address] [phone]      Follow up with AA meetings. (See printout for meeting location and time!)          Patient denies SI/HI:   Yes,  denies SI/HI    Safety Planning and Suicide Prevention discussed:  Yes,  discussed with pt  Barrier to discharge identified:No.  Summary and Recommendations: Pt attended discharge planning group and actively participated in group.  SW provided pt with today's workbook.  Pt presents with calm mood and affect.  Pt denies having depression, anxiety and SI/HI.  No recommendations from SW.  No further needs voiced by pt.  Pt stable to discharge.     Carmina Miller 06/09/2012, 10:29 AM

## 2012-06-09 NOTE — Progress Notes (Signed)
Patient ID: Adam Hartman, male   DOB: [birth year], 45 y.o.   MRN: [id number] Patient denies SI/HI and A/V hallucinations; reviewed AVS and prescriptions with patient; patient was given copy of AVS, prescriptions, and printout for AA meetings and card for the mobile crisis management; patient verbalized understanding; patient consent for [facility] at [facility] are done; patient verbalized that he received all belongings from locker 13; patient escorted to the lobby and met his mother who is his ride home

## 2012-06-09 NOTE — Progress Notes (Signed)
[facility] Group Notes:  (Counselor/Nursing/MHT/Case Management/Adjunct)  06/09/2012 2:25 PM  Type of Therapy:  Psychoeducational Skills  Participation Level:  Minimal  Participation Quality:  Appropriate and Attentive  Affect:  Flat  Cognitive:  Appropriate and Oriented  Insight:  Good  Engagement in Group:  Limited  Engagement in Therapy:  n/a  Modes of Intervention:  Activity, Education, Problem-solving, Socialization and Support  Summary of Progress/Problems: Shaiden attended Psychoeducational group that focused on using quality time with support systems/individuals to engage in healthy coping skills. Rathana participated in activity guessing about self and peers. Waldo was quiet but attentive  while group discussed who their supports are, how they can spend positive quality time with them as a coping skill and a way to strengthen their relationship. Ivory was given a homework assignment to find two ways to improve his support systems and 20 activities he can do to spend quality time with supports.      Wandra Scot 06/09/2012, 2:25 PM

## 2012-06-10 NOTE — Progress Notes (Signed)
[facility] Family/Significant Other Suicide Prevention Education  Suicide Prevention Education:  Contact Attempts:Patient's Mother, Pearlean Brownie at [phone] has been identified by the patient as the family member/significant other with whom the patient will be residing, and identified as the person(s) who will aid the patient in the event of a mental health crisis.  With written consent from the patient, two attempts were made to provide suicide prevention education, prior to and/or following the patient's discharge.  We were unsuccessful in providing suicide prevention education.  A suicide education pamphlet was given to the patient to share with family/significant other.  Date and time of first attempt: 06/08/2012 at 12:20PM Date and time of second attempt: 06/09/2012 at 9:40 AM and 3 PM Third attempt 06/10/2012 at 2:47 PM   Writer provided suicide prevention education directly to patient on 8/27; conversation included risk factors, warning signs and resources to contact for help. Mobile crisis services explained and contact card placed in chart for pt to receive at discharge.   Clide Dales 06/10/2012, 2:45 PM

## 2012-06-10 NOTE — Progress Notes (Signed)
Patient Discharge Instructions:  After Visit Summary (AVS):   Faxed to:  06/10/2012 Psychiatric Admission Assessment Note:   Faxed to:  06/10/2012 Suicide Risk Assessment - Discharge Assessment:   Faxed to:  06/10/2012 Faxed/Sent to the Next Level Care provider:  06/10/2012  Faxed to [facility] @ [phone]  Heloise Purpura, Eduard Clos, 06/10/2012, 6:22 PM

## 2012-06-25 NOTE — Discharge Summary (Signed)
Physician Discharge Summary Note  Patient:  Adam Hartman is an 46 y.o., male MRN:  [id number] DOB:  Feb 21, 1966 Patient phone:  [phone] (home)  Patient address:   [address] [place] [address]  Date of Admission:  06/05/2012 Date of Discharge: 06/09/12  Reason for Admission: see H&P.  Principal Problem:  *Alcohol abuse, continuous  Discharge Diagnoses: Alcohol Use Disorder; Stimulant Use Disorder, mild (Cocaine); Acute Grief   Past Medical History   Diagnosis  Date   .  Hypertension    .  Anxiety    .  Depression     Level of Care:  OP  Hospital Course:  Pt admitted for crisis stabilization, detox and treatment. Pt attended all therapeutic groups, was active in his treatment planning process and agreed the current medication regimen for further stability during recovery.  There were no acute issues during treatment and he was open to further substance abuse Tx.  All labs were reviewed in great detail and medical/psychiatric needs were addressed.  No acute safety issues were noted on the unit. Medications were reviewed with pt and medication education was provided. Mental health treatment, medication management and continued sobriety will mitigate against any potential increased risk of harm to self and/or others.  Discussed the importance of recovery with pt, as well as, tools to move forward in a healthy & safe manner using the 12 Step Process.    Consults: none.  Significant Diagnostic Studies:  See labs.  Discharge Vitals:   Blood pressure 129/87, pulse 83, temperature 98.3 F (36.8 C), temperature source Oral, resp. rate 20, height 6' (1.829 m), weight 117.028 kg (258 lb).  Physical Findings: AIMS: Facial and Oral Movements Muscles of Facial Expression: None, normal Lips and Perioral Area: None, normal Jaw: None, normal Tongue: None, normal,Extremity Movements Upper (arms, wrists, hands, fingers): None, normal Lower (legs, knees, ankles, toes): None, normal,  Trunk Movements Neck, shoulders, hips: None, normal, Overall Severity Severity of abnormal movements (highest score from questions above): None, normal Incapacitation due to abnormal movements: None, normal Patient's awareness of abnormal movements (rate only patient's report): No Awareness, Dental Status Current problems with teeth and/or dentures?: No Does patient usually wear dentures?: No  CIWA:  CIWA-Ar Total: 0  COWS:  COWS Total Score: 0   Mental Status Exam: See Mental Status Examination and Suicide Risk Assessment completed by Attending Physician prior to discharge.  Discharge destination:  Home  Is patient on multiple antipsychotic therapies at discharge:  No   Has Patient had three or more failed trials of antipsychotic monotherapy by history:  No  Recommended Plan for Multiple Antipsychotic Therapies: NA  Discharge Orders    Future Orders Please Complete By Expires   Diet - low sodium heart healthy          Medication List     As of 06/25/2012  9:06 AM    TAKE these medications      Indication    atenolol 25 MG tablet   Commonly known as: TENORMIN   Take 1 tablet (25 mg total) by mouth daily. For htn    Indication: High Blood Pressure      traZODone 50 MG tablet   Commonly known as: DESYREL   Take 1 tablet (50 mg total) by mouth at bedtime as needed for sleep.            Follow-up Information    Follow up with [facility] on 06/11/2012. (Appointment scheduled at 7:45 am, referral # [id number])  Contact information:   [address] [place], [place] [address] [phone]      Follow up with AA meetings. (See printout for meeting location and time!)         Plan Of Care/Follow-up recommendations: Pt seen and evaluated in treatment team today. Chart reviewed. Pt stable for and requesting discharge. Pt contracting for safety and does not currently meet [place] involuntary commitment criteria for continued hospitalization against his will. Mental health treatment,  medication management and continued sobriety will mitigate against the potential increased risk of harm to self and/or others. Discussed the importance of recovery further with pt, as well as, tools to move forward in a healthy & safe manner. Pt agreeable with the plan. Discussed with the team.  Recommend follow up with AA/NA. Diet: Heart Healthy. Activity: As tolerated.   Signed: Lupe Carney 06/25/2012, 9:06 AM

## 2016-12-12 HISTORY — PX: OTHER SURGICAL HISTORY: SHX169

## 2021-02-08 DIAGNOSIS — K7460 Unspecified cirrhosis of liver: Secondary | ICD-10-CM

## 2021-02-08 DIAGNOSIS — K219 Gastro-esophageal reflux disease without esophagitis: Secondary | ICD-10-CM

## 2021-02-08 DIAGNOSIS — R188 Other ascites: Secondary | ICD-10-CM

## 2021-02-08 DIAGNOSIS — K7031 Alcoholic cirrhosis of liver with ascites: Secondary | ICD-10-CM

## 2021-02-08 HISTORY — DX: Unspecified cirrhosis of liver: K74.60

## 2021-02-08 HISTORY — DX: Other ascites: R18.8

## 2021-02-15 ENCOUNTER — Other Ambulatory Visit: Payer: Self-pay

## 2021-02-15 ENCOUNTER — Encounter: Payer: Self-pay | Admitting: Physician Assistant

## 2021-02-15 ENCOUNTER — Encounter (HOSPITAL_COMMUNITY): Payer: Self-pay

## 2021-02-15 ENCOUNTER — Emergency Department (HOSPITAL_COMMUNITY): Payer: Self-pay

## 2021-02-15 ENCOUNTER — Ambulatory Visit: Payer: Self-pay | Admitting: Physician Assistant

## 2021-02-15 ENCOUNTER — Emergency Department (HOSPITAL_COMMUNITY)
Admission: EM | Admit: 2021-02-15 | Discharge: 2021-02-15 | Disposition: A | Discharge status: Home / Self Care | Payer: Self-pay | Attending: Emergency Medicine | Admitting: Emergency Medicine

## 2021-02-15 VITALS — BP 150/80 | HR 80 | Ht 70.0 in | Wt 278.2 lb

## 2021-02-15 DIAGNOSIS — R112 Nausea with vomiting, unspecified: Secondary | ICD-10-CM

## 2021-02-15 DIAGNOSIS — I10 Essential (primary) hypertension: Secondary | ICD-10-CM | POA: Insufficient documentation

## 2021-02-15 DIAGNOSIS — Z87891 Personal history of nicotine dependence: Secondary | ICD-10-CM | POA: Insufficient documentation

## 2021-02-15 DIAGNOSIS — R1084 Generalized abdominal pain: Secondary | ICD-10-CM

## 2021-02-15 DIAGNOSIS — R0602 Shortness of breath: Secondary | ICD-10-CM | POA: Insufficient documentation

## 2021-02-15 DIAGNOSIS — Z20822 Contact with and (suspected) exposure to covid-19: Secondary | ICD-10-CM | POA: Insufficient documentation

## 2021-02-15 DIAGNOSIS — R1011 Right upper quadrant pain: Secondary | ICD-10-CM | POA: Insufficient documentation

## 2021-02-15 DIAGNOSIS — R600 Localized edema: Secondary | ICD-10-CM | POA: Insufficient documentation

## 2021-02-15 LAB — URINALYSIS, ROUTINE W REFLEX MICROSCOPIC
Bilirubin Urine: NEGATIVE
Glucose, UA: NEGATIVE mg/dL
Hgb urine dipstick: NEGATIVE
Ketones, ur: NEGATIVE mg/dL
Leukocytes,Ua: NEGATIVE
Nitrite: NEGATIVE
Protein, ur: NEGATIVE mg/dL
Specific Gravity, Urine: 1.005 (ref 1.005–1.030)
pH: 7 (ref 5.0–8.0)

## 2021-02-15 LAB — CBC WITH DIFFERENTIAL/PLATELET
Abs Immature Granulocytes: 0.03 10*3/uL (ref 0.00–0.07)
Basophils Absolute: 0.1 10*3/uL (ref 0.0–0.1)
Basophils Relative: 1 %
Eosinophils Absolute: 0 10*3/uL (ref 0.0–0.5)
Eosinophils Relative: 0 %
HCT: 43.6 % (ref 39.0–52.0)
Hemoglobin: 15 g/dL (ref 13.0–17.0)
Immature Granulocytes: 0 %
Lymphocytes Relative: 12 %
Lymphs Abs: 1.3 10*3/uL (ref 0.7–4.0)
MCH: 33.8 pg (ref 26.0–34.0)
MCHC: 34.4 g/dL (ref 30.0–36.0)
MCV: 98.2 fL (ref 80.0–100.0)
Monocytes Absolute: 0.6 10*3/uL (ref 0.1–1.0)
Monocytes Relative: 5 %
Neutro Abs: 8.9 10*3/uL — ABNORMAL HIGH (ref 1.7–7.7)
Neutrophils Relative %: 82 %
Platelets: 176 10*3/uL (ref 150–400)
RBC: 4.44 MIL/uL (ref 4.22–5.81)
RDW: 14.1 % (ref 11.5–15.5)
WBC: 10.9 10*3/uL — ABNORMAL HIGH (ref 4.0–10.5)
nRBC: 0 % (ref 0.0–0.2)

## 2021-02-15 LAB — COMPREHENSIVE METABOLIC PANEL
ALT: 44 U/L (ref 0–44)
AST: 106 U/L — ABNORMAL HIGH (ref 15–41)
Albumin: 3.4 g/dL — ABNORMAL LOW (ref 3.5–5.0)
Alkaline Phosphatase: 110 U/L (ref 38–126)
Anion gap: 12 (ref 5–15)
BUN: 5 mg/dL — ABNORMAL LOW (ref 6–20)
CO2: 21 mmol/L — ABNORMAL LOW (ref 22–32)
Calcium: 9 mg/dL (ref 8.9–10.3)
Chloride: 105 mmol/L (ref 98–111)
Creatinine, Ser: 0.58 mg/dL — ABNORMAL LOW (ref 0.61–1.24)
GFR, Estimated: >60 mL/min (ref >60)
Glucose, Bld: 160 mg/dL — ABNORMAL HIGH (ref 70–99)
Potassium: 3.8 mmol/L (ref 3.5–5.1)
Sodium: 138 mmol/L (ref 135–145)
Total Bilirubin: 3 mg/dL — ABNORMAL HIGH (ref 0.3–1.2)
Total Protein: 7.3 g/dL (ref 6.5–8.1)

## 2021-02-15 LAB — LIPASE, BLOOD: Lipase: 42 U/L (ref 11–51)

## 2021-02-15 LAB — LACTIC ACID, PLASMA
Lactic Acid, Venous: 2.9 mmol/L (ref 0.5–1.9)
Lactic Acid, Venous: 1.9 mmol/L (ref 0.5–1.9)

## 2021-02-15 LAB — AMMONIA: Ammonia: 31 umol/L (ref 9–35)

## 2021-02-15 MED ORDER — MORPHINE SULFATE (PF) 4 MG/ML IV SOLN
4.0000 mg | Freq: Once | INTRAVENOUS | Status: AC
  Administered 2021-02-15: 4 mg via INTRAVENOUS
  Filled 2021-02-15: qty 1

## 2021-02-15 MED ORDER — HYDROMORPHONE HCL 1 MG/ML IJ SOLN
1.0000 mg | Freq: Once | INTRAMUSCULAR | Status: AC
  Administered 2021-02-15: 1 mg via INTRAVENOUS
  Filled 2021-02-15: qty 1

## 2021-02-15 MED ORDER — ONDANSETRON HCL 4 MG/2ML IJ SOLN
4.0000 mg | Freq: Once | INTRAMUSCULAR | Status: AC
  Administered 2021-02-15: 4 mg via INTRAVENOUS
  Filled 2021-02-15: qty 2

## 2021-02-15 MED ORDER — IOHEXOL 300 MG/ML  SOLN
100.0000 mL | Freq: Once | INTRAMUSCULAR | Status: AC | PRN
  Administered 2021-02-15: 100 mL via INTRAVENOUS

## 2021-02-15 NOTE — ED Provider Notes (Signed)
Patient taken in signout from [place] at shift change.  Patient newly diagnosed with cirrhosis, history of alcoholism.  He was sent in from his GIs office for severe right upper quadrant pain.  Concern for potential SBP.  Ultrasound back in April is negative for any gallstones.  Currently pain is 0 out of 10 after Dilaudid awaiting CT scan results.  Patient seen in shared visit with Dr. Silverio Lay. Patient CT negative for acute abnormality. On reevaluation patient has no abdominal pain or tenderness suggestive of SBP. He is afebrile with appears appropriate for discharge with OP GI follow up and OP paracentesis   Arthor Captain, PA-C 02/15/21 2313    Charlynne Pander, MD 02/17/21 1515

## 2021-02-15 NOTE — Progress Notes (Signed)
Agree with assessment and plan as outlined.  

## 2021-02-15 NOTE — Patient Instructions (Signed)
If you are age 55 or older, your body mass index should be between 23-30. Your Body mass index is 39.92 kg/m. If this is out of the aforementioned range listed, please consider follow up with your Primary Care Provider.  If you are age 14 or younger, your body mass index should be between 19-25. Your Body mass index is 39.92 kg/m. If this is out of the aformentioned range listed, please consider follow up with your Primary Care Provider.   Please report to the ER  It was a pleasure to see you today!  Thank you for trusting me with your gastrointestinal care!

## 2021-02-15 NOTE — ED Provider Notes (Signed)
[facility]-EMERGENCY DEPT Provider Note   CSN: [id number] Arrival date & time: 02/15/21  1511     History Chief Complaint  Patient presents with  . Abdominal Pain    Adam Hartman is a 55 y.o. male with past medical history of cirrhosis followed by [facility] who was sent her from GI to be admitted.    Reviewed patient's medical record and he was seen today by GI who sent him here with concern for severe alcohol hepatitis, neoplasm, pancreatitis, or spontaneous bacterial peritonitis.  On my examination, patient reports that he felt fine last evening.  However, shortly after waking up this morning he felt himself to be having severe upper abdominal pain, most notably in right upper quadrant radiating towards right thoracic region.  He has been having nausea and has had multiple episodes of nonbloody emesis.  He states that his most recent bowel movement was this morning, soft brown.  Normal.  Denies any recent melena or hematochezia.  He states that he had a similar episode occur a couple of weeks ago, however he was able to take MiraLAX which resolved his symptoms.  He is complaining of 10 out of 10 pain and states that this is worse than the episode a couple weeks ago.  Furthermore, if it has not improved despite taking MiraLAX.  He feels mildly short of breath due to the pain symptoms.  He denies any urinary symptoms, chest pain, recent fevers or chills, hematemesis, melena hematochezia, or other symptoms.  Patient tells me that his last alcoholic beverage was 3 months ago.  This is contrary to note from GI states that it was 01/15/2021.  I reviewed ultrasound of right upper quadrant obtained 01/21/2021 that was ordered by his primary care provider that demonstrated no gallstones or wall thickening.  There was however evidence of cirrhosis.  Only a small amount of abdominal ascites was noted adjacent to the liver and spleen.  HPI     Past Medical  History:  Diagnosis Date  . Alcoholism (HCC)   . Anxiety   . Ascites   . Cirrhosis (HCC)   . Colon polyps   . Depression   . GERD (gastroesophageal reflux disease)   . Hypertension     Patient Active Problem List   Diagnosis Date Noted  . Ascites 02/08/2021  . GERD (gastroesophageal reflux disease) 02/08/2021  . Cirrhosis (HCC) 02/08/2021  . Alcohol abuse, continuous 06/06/2012    Class: Acute    Past Surgical History:  Procedure Laterality Date  . WISDOM TOOTH EXTRACTION         Family History  Problem Relation Age of Onset  . Skin cancer Mother   . Peripheral vascular disease Father   . Stomach cancer Father   . Congenital heart disease Maternal Grandmother   . Heart attack Maternal Grandmother   . CAD Maternal Grandmother   . Dementia Maternal Grandfather   . CAD Maternal Grandfather   . Heart attack Maternal Grandfather   . Skin cancer Maternal Grandfather   . Congenital heart disease Paternal Grandmother   . Lung cancer Paternal Grandmother   . Cancer Paternal Grandfather   . COPD Paternal Grandfather   . Colon cancer Neg Hx   . Colon polyps Neg Hx     Social History   Tobacco Use  . Smoking status: Former Smoker    Packs/day: 3.00    Years: 15.00    Pack years: 45.00    Types: Cigarettes  Quit date: 11/24/2020    Years since quitting: 0.2  . Smokeless tobacco: Never Used  Vaping Use  . Vaping Use: Never used  Substance Use Topics  . Alcohol use: Not Currently    Alcohol/week: 18.0 - 19.0 standard drinks    Types: 18 - 19 Cans of beer per week    Comment: 01/15/2021  . Drug use: No    Home Medications Prior to Admission medications   Medication Sig Start Date End Date Taking? Authorizing Provider  LORazepam (ATIVAN) 0.5 MG tablet Take 0.5 mg by mouth every 8 (eight) hours as needed for anxiety.    [provider]    Allergies    Atenolol, Codeine, and Wellbutrin [bupropion]  Review of Systems   Review of Systems  All other  systems reviewed and are negative.   Physical Exam Updated Vital Signs BP 133/73 (BP Location: Right Arm)   Pulse 86   Temp 98.6 F (37 C)   Resp 19   Ht 5\' 10"  (1.778 m)   Wt 126.1 kg   SpO2 97%   BMI 39.89 kg/m   Physical Exam Vitals and nursing note reviewed. Exam conducted with a chaperone present.  Constitutional:      General: He is in acute distress.     Appearance: He is not toxic-appearing.  HENT:     Head: Normocephalic and atraumatic.     Mouth/Throat:     Comments: No jaundice on roof of mouth.  Question oral leukoplakia. Eyes:     Conjunctiva/sclera: Conjunctivae normal.     Comments: Very mild scleral icterus.  Cardiovascular:     Rate and Rhythm: Normal rate.  Pulmonary:     Effort: Pulmonary effort is normal. No respiratory distress.  Abdominal:     General: There is distension.     Tenderness: There is abdominal tenderness.     Comments: Protuberant, mildly distended abdomen.  Tenderness diffusely, most notably in right upper quadrant and epigastrium.  No overlying skin changes.    Musculoskeletal:     Cervical back: Normal range of motion. No rigidity.     Right lower leg: Edema present.     Left lower leg: Edema present.     Comments: 2+ pitting edema in lower extremities bilaterally.  Skin:    General: Skin is dry.  Neurological:     Mental Status: He is alert and oriented to person, place, and time.     GCS: GCS eye subscore is 4. GCS verbal subscore is 5. GCS motor subscore is 6.  Psychiatric:        Mood and Affect: Mood normal.        Behavior: Behavior normal.        Thought Content: Thought content normal.     ED Results / Procedures / Treatments   Labs (all labs ordered are listed, but only abnormal results are displayed) Labs Reviewed  CBC WITH DIFFERENTIAL/PLATELET - Abnormal; Notable for the following components:      Result Value   WBC 10.9 (*)    Neutro Abs 8.9 (*)    All other components within normal limits  COMPREHENSIVE  METABOLIC PANEL - Abnormal; Notable for the following components:   CO2 21 (*)    Glucose, Bld 160 (*)    BUN 5 (*)    Creatinine, Ser 0.58 (*)    Albumin 3.4 (*)    AST 106 (*)    Total Bilirubin 3.0 (*)    All other components within  normal limits  SARS CORONAVIRUS 2 (TAT 6-24 HRS)  CULTURE, BLOOD (ROUTINE X 2)  CULTURE, BLOOD (ROUTINE X 2)  LIPASE, BLOOD  AMMONIA  URINALYSIS, ROUTINE W REFLEX MICROSCOPIC  LACTIC ACID, PLASMA  LACTIC ACID, PLASMA    EKG None  Radiology No results found.  Procedures Procedures   Medications Ordered in ED Medications  morphine 4 MG/ML injection 4 mg (4 mg Intravenous Given 02/15/21 1602)  ondansetron (ZOFRAN) injection 4 mg (4 mg Intravenous Given 02/15/21 1603)  HYDROmorphone (DILAUDID) injection 1 mg (1 mg Intravenous Given 02/15/21 1735)  iohexol (OMNIPAQUE) 300 MG/ML solution 100 mL (100 mLs Intravenous Contrast Given 02/15/21 1749)    ED Course  I have reviewed the triage vital signs and the nursing notes.  Pertinent labs & imaging results that were available during my care of the patient were reviewed by me and considered in my medical decision making (see chart for details).    MDM Rules/Calculators/A&P                          Adam Hartman was evaluated in Emergency Department on 02/15/2021 for the symptoms described in the history of present illness. He was evaluated in the context of the global COVID-19 pandemic, which necessitated consideration that the patient might be at risk for infection with the SARS-CoV-2 virus that causes COVID-19. Institutional protocols and algorithms that pertain to the evaluation of patients at risk for COVID-19 are in a state of rapid change based on information released by regulatory bodies including the CDC and federal and state organizations. These policies and algorithms were followed during the patient's care in the ED.  I personally reviewed patient's medical chart and all notes from triage and  staff during today's encounter. I have also ordered and reviewed all labs and imaging that I felt to be medically necessary in the evaluation of this patient's complaints and with consideration of their physical exam. If needed, translation services were available and utilized.   Patient with severe, 10 out of 10 right upper quadrant/epigastric abdominal pain rating towards his back in context of recently diagnosed cirrhosis and small amount of ascites.  He was sent here by GI with concern for spontaneous bacterial peritonitis versus neoplasm/pancreatitis/alcohol hepatitis.  He initially was complaining of continued pain despite 4 mg morphine.  On subsequent evaluation, patient is feeling improved after Dilaudid.  At shift change care was transferred to Arthor Captain, PA-C who will follow pending studies, re-evaluate, and determine disposition.     Final Clinical Impression(s) / ED Diagnoses Final diagnoses:  RUQ abdominal pain    Rx / DC Orders ED Discharge Orders    None       Lorelee New, PA-C 02/15/21 1758    Charlynne Pander, MD 02/17/21 [phone]

## 2021-02-15 NOTE — Progress Notes (Signed)
Subjective:    Patient ID: Adam Hartman, male    DOB: September 13, 1966, 55 y.o.   MRN: [id number]  HPI Adam Hartman is a 55 year old white male, new to GI today [facility] /Dr. Quintin Hartman with new diagnosis of cirrhosis.  Patient had presented there in early April 2022 with complaints of gas and bloating rather diffuse abdominal discomfort.  He has history of very heavy EtOH use over the past 3 to 4 years.  He had been drinking 24 beers per day plus vodka daily.  Labs at that time showed WBC of 7.0/hemoglobin 15 hematocrit of 40.7 platelets 112 H. pylori IgG negative albumin 3.7/T bili 3.8/alk phos 124/AST 202/ALT 62. He had upper abdominal ultrasound done on 01/19/2021 which showed a cirrhotic appearing liver, patent portal vein, small amount of ascites, no gallstones evident, he has an enlarged spleen.  Patient quit drinking on 01/15/2021 when he learned of his diagnosis.  He had stopped smoking earlier this year.  He has been feeling horrible over the past couple of weeks and having progressively severe abdominal pain over the past week and a half which is described as constant aching and dull.  He complains of headache, body aches but no fever or chills.  He says his pain is worse in the epigastric and right upper quadrant radiating around into the back.  He has been nauseated and having frequent episodes of vomiting.  He says he vomited 3 times earlier today with no evidence of any blood.  He did have some vomiting 2 weeks ago which she thought contained some blood.  Bowel movements have been fairly normal no melena or hematochezia.  He has lost about 12 pounds over the past 2 weeks.  He says he is able to eat but has been having frequent vomiting.  He denies any regular use of aspirin or NSAIDs. No prior GI issues or GI evaluation. Patient had mentioned to the [place] when he was brought back to the room that he feels like he is dying. His wife says that she has been trying to  encourage him to go to the emergency room but he did not think the emergency room would do anything because he does not have any insurance. No current prescriptions other than Ativan which he was given recently after stopping alcohol. Vitals are stable currently blood pressure 150/80 pulse 80s  Review of Systems Pertinent positive and negative review of systems were noted in the above HPI section.  All other review of systems was otherwise negative.  Outpatient Encounter Medications as of 02/15/2021  Medication Sig  . LORazepam (ATIVAN) 0.5 MG tablet Take 0.5 mg by mouth every 8 (eight) hours as needed for anxiety.  . [DISCONTINUED] sildenafil (VIAGRA) 50 MG tablet Take 50 mg by mouth daily as needed for erectile dysfunction.   No facility-administered encounter medications on file as of 02/15/2021.   Allergies  Allergen Reactions  . Atenolol   . Codeine Itching  . Wellbutrin [Bupropion]    Patient Active Problem List   Diagnosis Date Noted  . Ascites 02/08/2021  . GERD (gastroesophageal reflux disease) 02/08/2021  . Cirrhosis ([place]) 02/08/2021  . Alcohol abuse, continuous 06/06/2012   Social History   Socioeconomic History  . Marital status: Legally Separated    Spouse name: Not on file  . Number of children: 3  . Years of education: Not on file  . Highest education level: Not on file  Occupational History  . Not on file  Tobacco  Use  . Smoking status: Former Smoker    Packs/day: 3.00    Years: 15.00    Pack years: 45.00    Types: Cigarettes    Quit date: 11/24/2020    Years since quitting: 0.2  . Smokeless tobacco: Never Used  Vaping Use  . Vaping Use: Never used  Substance and Sexual Activity  . Alcohol use: Not Currently    Alcohol/week: 18.0 - 19.0 standard drinks    Types: 18 - 19 Cans of beer per week    Comment: 01/15/2021  . Drug use: No  . Sexual activity: Not on file  Other Topics Concern  . Not on file  Social History Narrative  . Not on file   Social  Determinants of Health   Financial Resource Strain: Not on file  Food Insecurity: Not on file  Transportation Needs: Not on file  Physical Activity: Not on file  Stress: Not on file  Social Connections: Not on file  Intimate Partner Violence: Not on file    Mr. Adam Hartman family history includes CAD in his maternal grandfather and maternal grandmother; COPD in his paternal grandfather; Cancer in his paternal grandfather; Congenital heart disease in his maternal grandmother and paternal grandmother; Dementia in his maternal grandfather; Heart attack in his maternal grandfather and maternal grandmother; Lung cancer in his paternal grandmother; Peripheral vascular disease in his father; Skin cancer in his maternal grandfather and mother; Stomach cancer in his father.      Objective:    Vitals:   02/15/21 1416  BP: (!) 150/80  Pulse: 80    Physical Exam Well-developed ill-appearing older white male in no acute distress.  Accompanied by his wife- patient is sitting rocking back and forth holding his abdomen Weight, 278 BMI 39.9  HEENT; nontraumatic normocephalic, EOMI, PE R LA, sclera icteric evidence of hemorrhage of the eyelids Oropharynx; not examined today Neck; supple, no JVD Cardiovascular; regular rate and rhythm with S1-S2, no murmur rub or gallop Pulmonary; Clear bilaterally Abdomen; soft, protruding  nontense , rather diffusely tender but more so in the epigastrium and right upper quadrant, liver is palpable down 3 fingerbreadths below the right costal margin he is tender over the liver ,bowel sounds are active, no rebound Rectal; not done today Skin; benign exam, no jaundice rash or appreciable lesions Extremities; 2+ edema bilateral lower extremities to the shins Neuro/Psych; alert and oriented x4, no asterixis       Assessment & Plan:   #36 55 year old white male alcoholic with new diagnosis of cirrhosis made on ultrasound 01/15/2021 with finding of a cirrhotic  appearing liver, splenomegaly and a small amount of ascites.  Patient has been drinking heavily over the past 3 to 4 years up to 24 beers per day plus vodka. He stopped drinking on 01/15/2021 He has had gradually worsening abdominal pain over the past 2 weeks which is now constant primarily in the epigastrium and right upper quadrant radiating around into the back he has also been having frequent episodes of nausea and vomiting.  He believes he had some emesis of blood 2 weeks ago but has not seen blood since.  No melena or hematochezia no fever or chills. Complains of severe pain currently Most recent labs were done a month ago as outlined above consistent with mild alcoholic hepatitis.  Rule out severe EtOH hepatitis, rule out neoplasm, rule out pancreatitis, rule out SBP  Plan; patient is referred to the emergency room at [facility] and will need admission.  I  have spoken to triage nurse.  GI will follow once admitted I believe he needs CT of the abdomen and pelvis, pan labs, may need paracentesis, EGD, depending on CT results Will notify our inpatient team to see in consult.  Katrece Roediger Genia Harold PA-C 02/15/2021   Cc: Deloria Lair., MD

## 2021-02-15 NOTE — ED Triage Notes (Signed)
Sent in by GI doctor to be evaluated for abdominal pain x several weeks.

## 2021-02-15 NOTE — Discharge Instructions (Signed)

## 2021-02-15 NOTE — ED Notes (Signed)
Pt was given water. No complaints of nausea or vomiting after 5 mins.

## 2021-02-16 ENCOUNTER — Telehealth: Payer: Self-pay | Admitting: Physician Assistant

## 2021-02-16 ENCOUNTER — Encounter (HOSPITAL_COMMUNITY): Payer: Self-pay

## 2021-02-16 ENCOUNTER — Observation Stay (HOSPITAL_COMMUNITY): Payer: Self-pay

## 2021-02-16 ENCOUNTER — Inpatient Hospital Stay (HOSPITAL_COMMUNITY)
Admission: EM | Admit: 2021-02-16 | Discharge: 2021-02-21 | DRG: 432 | Disposition: A | Discharge status: Home / Self Care | Payer: Self-pay | Attending: Internal Medicine | Admitting: Internal Medicine

## 2021-02-16 ENCOUNTER — Other Ambulatory Visit: Payer: Self-pay

## 2021-02-16 DIAGNOSIS — I8510 Secondary esophageal varices without bleeding: Secondary | ICD-10-CM | POA: Diagnosis present

## 2021-02-16 DIAGNOSIS — R932 Abnormal findings on diagnostic imaging of liver and biliary tract: Secondary | ICD-10-CM | POA: Diagnosis present

## 2021-02-16 DIAGNOSIS — F1010 Alcohol abuse, uncomplicated: Secondary | ICD-10-CM

## 2021-02-16 DIAGNOSIS — Z888 Allergy status to other drugs, medicaments and biological substances status: Secondary | ICD-10-CM

## 2021-02-16 DIAGNOSIS — G4730 Sleep apnea, unspecified: Secondary | ICD-10-CM | POA: Diagnosis present

## 2021-02-16 DIAGNOSIS — K7290 Hepatic failure, unspecified without coma: Secondary | ICD-10-CM | POA: Diagnosis present

## 2021-02-16 DIAGNOSIS — D684 Acquired coagulation factor deficiency: Secondary | ICD-10-CM | POA: Diagnosis present

## 2021-02-16 DIAGNOSIS — K769 Liver disease, unspecified: Secondary | ICD-10-CM

## 2021-02-16 DIAGNOSIS — T40605A Adverse effect of unspecified narcotics, initial encounter: Secondary | ICD-10-CM | POA: Diagnosis present

## 2021-02-16 DIAGNOSIS — R109 Unspecified abdominal pain: Secondary | ICD-10-CM

## 2021-02-16 DIAGNOSIS — K3189 Other diseases of stomach and duodenum: Secondary | ICD-10-CM | POA: Diagnosis present

## 2021-02-16 DIAGNOSIS — F1020 Alcohol dependence, uncomplicated: Secondary | ICD-10-CM | POA: Diagnosis present

## 2021-02-16 DIAGNOSIS — Z885 Allergy status to narcotic agent status: Secondary | ICD-10-CM

## 2021-02-16 DIAGNOSIS — I10 Essential (primary) hypertension: Secondary | ICD-10-CM | POA: Diagnosis present

## 2021-02-16 DIAGNOSIS — Z825 Family history of asthma and other chronic lower respiratory diseases: Secondary | ICD-10-CM

## 2021-02-16 DIAGNOSIS — G9341 Metabolic encephalopathy: Secondary | ICD-10-CM | POA: Diagnosis present

## 2021-02-16 DIAGNOSIS — K7460 Unspecified cirrhosis of liver: Secondary | ICD-10-CM | POA: Diagnosis present

## 2021-02-16 DIAGNOSIS — K766 Portal hypertension: Secondary | ICD-10-CM

## 2021-02-16 DIAGNOSIS — E871 Hypo-osmolality and hyponatremia: Secondary | ICD-10-CM | POA: Diagnosis present

## 2021-02-16 DIAGNOSIS — R1010 Upper abdominal pain, unspecified: Secondary | ICD-10-CM

## 2021-02-16 DIAGNOSIS — Z87891 Personal history of nicotine dependence: Secondary | ICD-10-CM

## 2021-02-16 DIAGNOSIS — K219 Gastro-esophageal reflux disease without esophagitis: Secondary | ICD-10-CM | POA: Diagnosis present

## 2021-02-16 DIAGNOSIS — R160 Hepatomegaly, not elsewhere classified: Secondary | ICD-10-CM

## 2021-02-16 DIAGNOSIS — Z801 Family history of malignant neoplasm of trachea, bronchus and lung: Secondary | ICD-10-CM

## 2021-02-16 DIAGNOSIS — Z8249 Family history of ischemic heart disease and other diseases of the circulatory system: Secondary | ICD-10-CM

## 2021-02-16 DIAGNOSIS — Z808 Family history of malignant neoplasm of other organs or systems: Secondary | ICD-10-CM

## 2021-02-16 DIAGNOSIS — Z20822 Contact with and (suspected) exposure to covid-19: Secondary | ICD-10-CM | POA: Diagnosis present

## 2021-02-16 DIAGNOSIS — K7031 Alcoholic cirrhosis of liver with ascites: Principal | ICD-10-CM | POA: Diagnosis present

## 2021-02-16 DIAGNOSIS — G928 Other toxic encephalopathy: Secondary | ICD-10-CM | POA: Diagnosis present

## 2021-02-16 DIAGNOSIS — R000 Tachycardia, unspecified: Secondary | ICD-10-CM | POA: Diagnosis present

## 2021-02-16 DIAGNOSIS — Z800 Family history of malignant neoplasm of digestive organs: Secondary | ICD-10-CM

## 2021-02-16 DIAGNOSIS — R1084 Generalized abdominal pain: Secondary | ICD-10-CM

## 2021-02-16 DIAGNOSIS — R188 Other ascites: Secondary | ICD-10-CM | POA: Diagnosis present

## 2021-02-16 DIAGNOSIS — R161 Splenomegaly, not elsewhere classified: Secondary | ICD-10-CM | POA: Diagnosis present

## 2021-02-16 DIAGNOSIS — Z8719 Personal history of other diseases of the digestive system: Secondary | ICD-10-CM

## 2021-02-16 LAB — CBC WITH DIFFERENTIAL/PLATELET
Abs Immature Granulocytes: 0.04 10*3/uL (ref 0.00–0.07)
Basophils Absolute: 0.1 10*3/uL (ref 0.0–0.1)
Basophils Relative: 1 %
Eosinophils Absolute: 0 10*3/uL (ref 0.0–0.5)
Eosinophils Relative: 0 %
HCT: 44.7 % (ref 39.0–52.0)
Hemoglobin: 15.6 g/dL (ref 13.0–17.0)
Immature Granulocytes: 0 %
Lymphocytes Relative: 17 %
Lymphs Abs: 1.6 10*3/uL (ref 0.7–4.0)
MCH: 33.9 pg (ref 26.0–34.0)
MCHC: 34.9 g/dL (ref 30.0–36.0)
MCV: 97.2 fL (ref 80.0–100.0)
Monocytes Absolute: 0.7 10*3/uL (ref 0.1–1.0)
Monocytes Relative: 8 %
Neutro Abs: 6.7 10*3/uL (ref 1.7–7.7)
Neutrophils Relative %: 74 %
Platelets: 180 10*3/uL (ref 150–400)
RBC: 4.6 MIL/uL (ref 4.22–5.81)
RDW: 14.3 % (ref 11.5–15.5)
WBC: 9.1 10*3/uL (ref 4.0–10.5)
nRBC: 0 % (ref 0.0–0.2)

## 2021-02-16 LAB — SARS CORONAVIRUS 2 (TAT 6-24 HRS): SARS Coronavirus 2: NEGATIVE

## 2021-02-16 LAB — COMPREHENSIVE METABOLIC PANEL
ALT: 42 U/L (ref 0–44)
AST: 94 U/L — ABNORMAL HIGH (ref 15–41)
Albumin: 3.3 g/dL — ABNORMAL LOW (ref 3.5–5.0)
Alkaline Phosphatase: 84 U/L (ref 38–126)
Anion gap: 9 (ref 5–15)
BUN: 5 mg/dL — ABNORMAL LOW (ref 6–20)
CO2: 21 mmol/L — ABNORMAL LOW (ref 22–32)
Calcium: 8.8 mg/dL — ABNORMAL LOW (ref 8.9–10.3)
Chloride: 104 mmol/L (ref 98–111)
Creatinine, Ser: 0.62 mg/dL (ref 0.61–1.24)
GFR, Estimated: >60 mL/min (ref >60)
Glucose, Bld: 149 mg/dL — ABNORMAL HIGH (ref 70–99)
Potassium: 4 mmol/L (ref 3.5–5.1)
Sodium: 134 mmol/L — ABNORMAL LOW (ref 135–145)
Total Bilirubin: 3.6 mg/dL — ABNORMAL HIGH (ref 0.3–1.2)
Total Protein: 7.3 g/dL (ref 6.5–8.1)

## 2021-02-16 LAB — LIPASE, BLOOD: Lipase: 43 U/L (ref 11–51)

## 2021-02-16 LAB — PROTIME-INR
INR: 1.6 — ABNORMAL HIGH (ref 0.8–1.2)
Prothrombin Time: 19 seconds — ABNORMAL HIGH (ref 11.4–15.2)

## 2021-02-16 MED ORDER — THIAMINE HCL 100 MG PO TABS
100.0000 mg | ORAL_TABLET | Freq: Every day | ORAL | Status: DC
  Administered 2021-02-16 – 2021-02-21 (×5): 100 mg via ORAL
  Filled 2021-02-16 (×6): qty 1

## 2021-02-16 MED ORDER — PANTOPRAZOLE SODIUM 40 MG IV SOLR
40.0000 mg | Freq: Two times a day (BID) | INTRAVENOUS | Status: DC
  Administered 2021-02-16 – 2021-02-18 (×4): 40 mg via INTRAVENOUS
  Filled 2021-02-16 (×4): qty 40

## 2021-02-16 MED ORDER — SPIRONOLACTONE 25 MG PO TABS
25.0000 mg | ORAL_TABLET | Freq: Every day | ORAL | Status: DC
  Administered 2021-02-16 – 2021-02-20 (×4): 25 mg via ORAL
  Filled 2021-02-16 (×6): qty 1

## 2021-02-16 MED ORDER — GADOBUTROL 1 MMOL/ML IV SOLN
10.0000 mL | Freq: Once | INTRAVENOUS | Status: AC | PRN
  Administered 2021-02-16: 10 mL via INTRAVENOUS

## 2021-02-16 MED ORDER — TRAMADOL HCL 50 MG PO TABS
50.0000 mg | ORAL_TABLET | Freq: Three times a day (TID) | ORAL | Status: DC | PRN
Start: 2021-02-16 — End: 2021-02-21
  Administered 2021-02-18 – 2021-02-20 (×2): 50 mg via ORAL
  Filled 2021-02-16 (×2): qty 1

## 2021-02-16 MED ORDER — HYDROMORPHONE HCL 1 MG/ML IJ SOLN
1.0000 mg | INTRAMUSCULAR | Status: DC | PRN
Start: 2021-02-16 — End: 2021-02-17
  Administered 2021-02-16 – 2021-02-17 (×3): 2 mg via INTRAVENOUS
  Filled 2021-02-16 (×3): qty 2

## 2021-02-16 MED ORDER — ONDANSETRON HCL 4 MG/2ML IJ SOLN
4.0000 mg | Freq: Four times a day (QID) | INTRAMUSCULAR | Status: DC | PRN
Start: 2021-02-16 — End: 2021-02-21

## 2021-02-16 MED ORDER — ONDANSETRON HCL 4 MG PO TABS: 4.0000 mg | ORAL_TABLET | Freq: Four times a day (QID) | ORAL | Status: DC | PRN

## 2021-02-16 MED ORDER — ALBUTEROL SULFATE (2.5 MG/3ML) 0.083% IN NEBU: 2.5000 mg | INHALATION_SOLUTION | RESPIRATORY_TRACT | Status: DC | PRN

## 2021-02-16 MED ORDER — LORAZEPAM 0.5 MG PO TABS: 0.5000 mg | ORAL_TABLET | Freq: Three times a day (TID) | ORAL | Status: DC | PRN

## 2021-02-16 MED ORDER — HYDROMORPHONE HCL 1 MG/ML IJ SOLN: 0.5000 mg | INTRAMUSCULAR | Status: DC | PRN

## 2021-02-16 MED ORDER — HYDROMORPHONE HCL 1 MG/ML IJ SOLN
1.0000 mg | Freq: Once | INTRAMUSCULAR | Status: AC
  Administered 2021-02-16: 1 mg via INTRAMUSCULAR
  Filled 2021-02-16: qty 1

## 2021-02-16 NOTE — Telephone Encounter (Signed)
Amy do you know what happened with this patient? I thought you had coordinated admission? If he is in that much pain he needs a paracentesis to rule out SBP and probable admission. I would direct him back to the ED if he is in that much pain, I doubt that can be coordinated today as outpatient.

## 2021-02-16 NOTE — Telephone Encounter (Signed)
Spoke with pts wife and she is aware and will take him back to [facility] ER. Doug Sou PA aware that pt will be coming to [facility]

## 2021-02-16 NOTE — ED Notes (Signed)
Urine leaked in the bag, lab needs a recollect.

## 2021-02-16 NOTE — ED Provider Notes (Signed)
[facility] EMERGENCY DEPARTMENT Provider Note  CSN: [id number] Arrival date & time: 02/16/21 1252    History Chief Complaint  Patient presents with  . Abdominal Pain    HPI  Adam Hartman is a 55 y.o. male with recent diagnosis of alcohol cirrhosis was in the ED last night for abdominal pain. Labs were unremarkable, CT showed mild-mod ascites and his pain was controlled after Dilaudid but was ultimately discharged home with instructions for outpatient GI follow up. His pain worsened again during the night and this morning so his GI doctor directed him back to the ED for admission, paracentesis. He was seen by GI PA in the ED today while waiting for an ED exam room. IR Paracentesis, MR liver were ordered.    Past Medical History:  Diagnosis Date  . Alcoholism (HCC)   . Anxiety   . Ascites   . Cirrhosis (HCC)   . Colon polyps   . Depression   . GERD (gastroesophageal reflux disease)   . Hypertension     Past Surgical History:  Procedure Laterality Date  . WISDOM TOOTH EXTRACTION      Family History  Problem Relation Age of Onset  . Skin cancer Mother   . Peripheral vascular disease Father   . Stomach cancer Father   . Congenital heart disease Maternal Grandmother   . Heart attack Maternal Grandmother   . CAD Maternal Grandmother   . Dementia Maternal Grandfather   . CAD Maternal Grandfather   . Heart attack Maternal Grandfather   . Skin cancer Maternal Grandfather   . Congenital heart disease Paternal Grandmother   . Lung cancer Paternal Grandmother   . Cancer Paternal Grandfather   . COPD Paternal Grandfather   . Colon cancer Neg Hx   . Colon polyps Neg Hx     Social History   Tobacco Use  . Smoking status: Former Smoker    Packs/day: 3.00    Years: 15.00    Pack years: 45.00    Types: Cigarettes    Quit date: 11/24/2020    Years since quitting: 0.2  . Smokeless tobacco: Never Used  Vaping Use  . Vaping Use: Never used  Substance Use Topics  .  Alcohol use: Not Currently    Alcohol/week: 18.0 - 19.0 standard drinks    Types: 18 - 19 Cans of beer per week    Comment: 01/15/2021  . Drug use: No     Home Medications Prior to Admission medications   Medication Sig Start Date End Date Taking? Authorizing Provider  LORazepam (ATIVAN) 0.5 MG tablet Take 0.5 mg by mouth every 8 (eight) hours as needed for anxiety.    [provider]  pantoprazole (PROTONIX) 40 MG tablet Take 40 mg by mouth daily as needed (indigestion).    [provider]     Allergies    Atenolol, Codeine, and Wellbutrin [bupropion]   Review of Systems   Review of Systems A comprehensive review of systems was completed and negative except as noted in HPI.    Physical Exam BP 129/69   Pulse 73   Temp 98.3 F (36.8 C) (Oral)   Resp 18   SpO2 91%   Physical Exam Vitals and nursing note reviewed.  Constitutional:      Appearance: Normal appearance.  HENT:     Head: Normocephalic and atraumatic.     Nose: Nose normal.     Mouth/Throat:     Mouth: Mucous membranes are moist.  Eyes:  Extraocular Movements: Extraocular movements intact.     Conjunctiva/sclera: Conjunctivae normal.  Cardiovascular:     Rate and Rhythm: Normal rate.  Pulmonary:     Effort: Pulmonary effort is normal.     Breath sounds: Normal breath sounds.  Abdominal:     General: Abdomen is protuberant. There is no distension.     Palpations: Abdomen is soft. There is no fluid wave.     Tenderness: There is generalized abdominal tenderness. There is no guarding. Negative signs include Murphy's sign and McBurney's sign.  Musculoskeletal:        General: No swelling. Normal range of motion.     Cervical back: Neck supple.  Skin:    General: Skin is warm and dry.  Neurological:     General: No focal deficit present.     Mental Status: He is alert.  Psychiatric:        Mood and Affect: Mood normal.      ED Results / Procedures / Treatments   Labs (all  labs ordered are listed, but only abnormal results are displayed) Labs Reviewed  COMPREHENSIVE METABOLIC PANEL - Abnormal; Notable for the following components:      Result Value   Sodium 134 (*)    CO2 21 (*)    Glucose, Bld 149 (*)    BUN 5 (*)    Calcium 8.8 (*)    Albumin 3.3 (*)    AST 94 (*)    Total Bilirubin 3.6 (*)    All other components within normal limits  CBC WITH DIFFERENTIAL/PLATELET  LIPASE, BLOOD  URINALYSIS, ROUTINE W REFLEX MICROSCOPIC  PROTIME-INR  AFP TUMOR MARKER    EKG None  Radiology CT ABDOMEN PELVIS W CONTRAST  Result Date: 02/15/2021 CLINICAL DATA:  Severe abdominal pain and distention. Alcoholic cirrhosis. EXAM: CT ABDOMEN AND PELVIS WITH CONTRAST TECHNIQUE: Multidetector CT imaging of the abdomen and pelvis was performed using the standard protocol following bolus administration of intravenous contrast. CONTRAST:  OMNIPAQUE IOHEXOL 300 MG/ML  SOLN COMPARISON:  None. FINDINGS: Lower Chest: No acute findings. Hepatobiliary: Hepatic cirrhosis is demonstrated. An ill-defined low-attenuation lesion is seen in the posterior right hepatic lobe measuring approximately 2 cm on image 43/2. A tiny cyst is seen in the anterior liver dome. Recanalization of paraumbilical veins is consistent with portal venous hypertension. Gallbladder is unremarkable. No evidence of biliary ductal dilatation. Pancreas:  No mass or inflammatory changes. Spleen: Moderate splenomegaly with length measuring 15-16 cm. No masses identified. Adrenals/Urinary Tract: No masses identified. No evidence of ureteral calculi or hydronephrosis. Stomach/Bowel: No evidence of obstruction, inflammatory process or abnormal fluid collections. Vascular/Lymphatic: No pathologically enlarged lymph nodes. No acute vascular findings. Aortic atherosclerotic calcification noted. Abdominal portosystemic venous collaterals are seen within the abdomen including esophageal varices, consistent with portal venous  hypertension. Reproductive:  No mass or other significant abnormality. Other:  Mild-to-moderate ascites seen within the abdomen pelvis. Musculoskeletal:  No suspicious bone lesions identified. IMPRESSION: Hepatic cirrhosis, and findings of portal venous hypertension including esophageal varices and splenomegaly. Mild-to-moderate ascites. 2 cm ill-defined low-attenuation lesion in posterior right hepatic lobe. Neoplasm cannot be excluded. Abdomen MRI without and with contrast recommended for further characterization. Aortic Atherosclerosis (ICD10-I70.0). Electronically Signed   By: Danae Orleans M.D.   On: 02/15/2021 18:21    Procedures Procedures  Medications Ordered in the ED Medications  HYDROmorphone (DILAUDID) injection 1 mg (has no administration in time range)     MDM Rules/Calculators/A&P MDM No significant change in labs from  yesterday. No peritoneal signs on exam. Plan admission as per GI consult. Spoke with Dr. Natale Milch, Hospitalist who will admit.  ED Course  I have reviewed the triage vital signs and the nursing notes.  Pertinent labs & imaging results that were available during my care of the patient were reviewed by me and considered in my medical decision making (see chart for details).     Final Clinical Impression(s) / ED Diagnoses Final diagnoses:  None    Rx / DC Orders ED Discharge Orders    None       Pollyann Savoy, MD 02/16/21 1732

## 2021-02-16 NOTE — Telephone Encounter (Signed)
Inbound call from patient wife, Judeth Cornfield. States after visit yesterday 5/5. Patient went to hospital. CT scan and ultrasound showed a lot of fluid in his belly. Hospital did not admit, received instructions to call first thing this morning for paracentesis. Best contact number [phone]

## 2021-02-16 NOTE — H&P (Signed)
**Note Adam-Identified via Obfuscation** History and Physical    DOA: 02/16/2021  PCP: Juliette AlcideBurdine, Steven E, MD  Patient coming from: Home  Chief Complaint: Sent from GI clinic for new cirrhosis.  HPI: Adam Hartman is a 55 y.o. male with history h/o hypertension, GERD, heavy alcohol use, anxiety/depression presented to GI clinic yesterday with complaints of abdominal pain-diffuse with alcohol use until recently (quit more than  a month back).  Ultrasonogram performed in the clinic showed liver cirrhosis with small amount of ascites, splenomegaly.  Given complaints of severe pain he was referred to the ED-CT abdomen/pelvis obtained showing hepatic cirrhosis/mild to moderate ascites and findings consistent with portal venous hypertension, esophageal varices and splenomegaly.  Also reported on CT was a 2 cm right hepatic lobe ill-defined lesion, neoplasm could not be excluded per radiology and abdominal MRI recommended for further characterization.  Patient was given pain medications with which he improved and was discharged home yesterday but presented back to the ED with recurrent severe abdominal pain this morning associated with nausea/dry heaving. Labs today show normal CBC, chemistry panel shows-sodium 134, total bili 3.6, ALP 84, ALT 42, AST 94, albumin 3.3, lipase WNL.  UA in the ED unremarkable yesterday. GI recommended admission for pain control and further work-up with MRI/IR assisted paracentesis for new onset cirrhosis.  Patient currently reports epigastric-mid abdominal pain, 6/10, radiating to flank and back.  Denies any tremors, melena, hematochezia.  Awake alert oriented and able to participate in interview/physical exam.    Review of Systems: As per HPI, otherwise review of systems negative.    Past Medical History:  Diagnosis Date  . Alcoholism (HCC)   . Anxiety   . Ascites   . Cirrhosis (HCC)   . Colon polyps   . Depression   . GERD (gastroesophageal reflux disease)   . Hypertension     Past Surgical  History:  Procedure Laterality Date  . WISDOM TOOTH EXTRACTION      Social history:  reports that he quit smoking about 2 months ago. His smoking use included cigarettes. He has a 45.00 pack-year smoking history. He has never used smokeless tobacco. He reports previous alcohol use of about 18.0 - 19.0 standard drinks of alcohol per week. He reports that he does not use drugs.   Allergies  Allergen Reactions  . Atenolol   . Codeine Itching  . Wellbutrin [Bupropion]     Family History  Problem Relation Age of Onset  . Skin cancer Mother   . Peripheral vascular disease Father   . Stomach cancer Father   . Congenital heart disease Maternal Grandmother   . Heart attack Maternal Grandmother   . CAD Maternal Grandmother   . Dementia Maternal Grandfather   . CAD Maternal Grandfather   . Heart attack Maternal Grandfather   . Skin cancer Maternal Grandfather   . Congenital heart disease Paternal Grandmother   . Lung cancer Paternal Grandmother   . Cancer Paternal Grandfather   . COPD Paternal Grandfather   . Colon cancer Neg Hx   . Colon polyps Neg Hx       Prior to Admission medications   Medication Sig Start Date End Date Taking? Authorizing Provider  LORazepam (ATIVAN) 0.5 MG tablet Take 0.5 mg by mouth every 8 (eight) hours as needed for anxiety.    [provider]  pantoprazole (PROTONIX) 40 MG tablet Take 40 mg by mouth daily as needed (indigestion).    [provider]    Physical Exam: Vitals:   02/16/21 1645  02/16/21 1700 02/16/21 1715 02/16/21 1742  BP:  129/69  (!) 144/83  Pulse: 84 72 73 84  Resp:  18  18  Temp:    97.9 F (36.6 C)  TempSrc:    Oral  SpO2: 96% 93% 91% 98%    Constitutional: NAD, calm, comfortable Eyes: PERRL, lids and conjunctivae normal ENMT: Mucous membranes are moist. Posterior pharynx clear of any exudate or lesions.Normal dentition.  Neck: normal, supple, no masses, no thyromegaly Respiratory: clear to auscultation  bilaterally, no wheezing, no crackles. Normal respiratory effort. No accessory muscle use.  Cardiovascular: Regular rate and rhythm, no murmurs / rubs / gallops. No extremity edema. 2+ pedal pulses. No carotid bruits.  Abdomen: Somewhat distended, tympanic in the center but dull to percussion on the sides.  Tenderness to deep palpation along epigastrium, no guarding or rebound. Bowel sounds positive.  Musculoskeletal: no clubbing / cyanosis. No joint deformity upper and lower extremities. Good ROM, no contractures. Normal muscle tone.  Neurologic: CN 2-12 grossly intact. Sensation intact, DTR normal. Strength 5/5 in all 4.  Psychiatric: Normal judgment and insight. Alert and oriented x 3. Normal mood.  SKIN/catheters: no rashes, lesions, ulcers. No induration  Labs on Admission: I have personally reviewed following labs and imaging studies  CBC: Recent Labs  Lab 02/15/21 1527 02/16/21 1349  WBC 10.9* 9.1  NEUTROABS 8.9* 6.7  HGB 15.0 15.6  HCT 43.6 44.7  MCV 98.2 97.2  PLT 176 180   Basic Metabolic Panel: Recent Labs  Lab 02/15/21 1527 02/16/21 1349  NA 138 134*  K 3.8 4.0  CL 105 104  CO2 21* 21*  GLUCOSE 160* 149*  BUN 5* 5*  CREATININE 0.58* 0.62  CALCIUM 9.0 8.8*   GFR: Estimated Creatinine Clearance: 140.6 mL/min (by C-G formula based on SCr of 0.62 mg/dL). Recent Labs  Lab 02/15/21 1527 02/15/21 1554 02/15/21 1754 02/16/21 1349  WBC 10.9*  --   --  9.1  LATICACIDVEN  --  2.9* 1.9  --    Liver Function Tests: Recent Labs  Lab 02/15/21 1527 02/16/21 1349  AST 106* 94*  ALT 44 42  ALKPHOS 110 84  BILITOT 3.0* 3.6*  PROT 7.3 7.3  ALBUMIN 3.4* 3.3*   Recent Labs  Lab 02/15/21 1527 02/16/21 1349  LIPASE 42 43   Recent Labs  Lab 02/15/21 1527  AMMONIA 31   Coagulation Profile: No results for input(s): INR, PROTIME in the last 168 hours. Cardiac Enzymes: No results for input(s): CKTOTAL, CKMB, CKMBINDEX, TROPONINI in the last 168 hours. BNP  (last 3 results) No results for input(s): PROBNP in the last 8760 hours. HbA1C: No results for input(s): HGBA1C in the last 72 hours. CBG: No results for input(s): GLUCAP in the last 168 hours. Lipid Profile: No results for input(s): CHOL, HDL, LDLCALC, TRIG, CHOLHDL, LDLDIRECT in the last 72 hours. Thyroid Function Tests: No results for input(s): TSH, T4TOTAL, FREET4, T3FREE, THYROIDAB in the last 72 hours. Anemia Panel: No results for input(s): VITAMINB12, FOLATE, FERRITIN, TIBC, IRON, RETICCTPCT in the last 72 hours. Urine analysis:    Component Value Date/Time   COLORURINE YELLOW 02/15/2021 1856   APPEARANCEUR CLEAR 02/15/2021 1856   LABSPEC 1.005 02/15/2021 1856   PHURINE 7.0 02/15/2021 1856   GLUCOSEU NEGATIVE 02/15/2021 1856   HGBUR NEGATIVE 02/15/2021 1856   BILIRUBINUR NEGATIVE 02/15/2021 1856   KETONESUR NEGATIVE 02/15/2021 1856   PROTEINUR NEGATIVE 02/15/2021 1856   UROBILINOGEN 0.2 06/05/2012 0745   NITRITE NEGATIVE 02/15/2021 1856  LEUKOCYTESUR NEGATIVE 02/15/2021 1856    Radiological Exams on Admission: Personally reviewed  CT ABDOMEN PELVIS W CONTRAST  Result Date: 02/15/2021 CLINICAL DATA:  Severe abdominal pain and distention. Alcoholic cirrhosis. EXAM: CT ABDOMEN AND PELVIS WITH CONTRAST TECHNIQUE: Multidetector CT imaging of the abdomen and pelvis was performed using the standard protocol following bolus administration of intravenous contrast. CONTRAST:  OMNIPAQUE IOHEXOL 300 MG/ML  SOLN COMPARISON:  None. FINDINGS: Lower Chest: No acute findings. Hepatobiliary: Hepatic cirrhosis is demonstrated. An ill-defined low-attenuation lesion is seen in the posterior right hepatic lobe measuring approximately 2 cm on image 43/2. A tiny cyst is seen in the anterior liver dome. Recanalization of paraumbilical veins is consistent with portal venous hypertension. Gallbladder is unremarkable. No evidence of biliary ductal dilatation. Pancreas:  No mass or inflammatory  changes. Spleen: Moderate splenomegaly with length measuring 15-16 cm. No masses identified. Adrenals/Urinary Tract: No masses identified. No evidence of ureteral calculi or hydronephrosis. Stomach/Bowel: No evidence of obstruction, inflammatory process or abnormal fluid collections. Vascular/Lymphatic: No pathologically enlarged lymph nodes. No acute vascular findings. Aortic atherosclerotic calcification noted. Abdominal portosystemic venous collaterals are seen within the abdomen including esophageal varices, consistent with portal venous hypertension. Reproductive:  No mass or other significant abnormality. Other:  Mild-to-moderate ascites seen within the abdomen pelvis. Musculoskeletal:  No suspicious bone lesions identified. IMPRESSION: Hepatic cirrhosis, and findings of portal venous hypertension including esophageal varices and splenomegaly. Mild-to-moderate ascites. 2 cm ill-defined low-attenuation lesion in posterior right hepatic lobe. Neoplasm cannot be excluded. Abdomen MRI without and with contrast recommended for further characterization. Aortic Atherosclerosis (ICD10-I70.0). Electronically Signed   By: Danae Orleans M.D.   On: 02/15/2021 18:21         Assessment and Plan:   Principal Problem:   Abdominal pain Active Problems:   Alcohol abuse   Cirrhosis (HCC)   Ascites   GERD (gastroesophageal reflux disease)   Portal hypertension (HCC)   Secondary esophageal varices without bleeding (HCC)    1.  Abdominal pain, dyspepsia: Unclear etiology. CT abdomen findings as above.  Alcoholic gastritis versus GERD vs ?  Peptic ulcer disease.  Given complaints of nausea/vomiting-will admit with IV PPI, as needed antiemetics and slow diet advancement.  Pain control with p.o. and IV meds Vague history of hematemesis x1 episode noted by wife-hemoglobin stable.  No active bleeding currently.  Stool occult blood pending.  2.Liver cirrhosis-new diagnosis: Complicated by splenomegaly, portal  hypertension, ascites and esophageal varices.  GI following and ordered AFP, PT/INR, diagnostic paracentesis by IR in AM.  GI may consider EGD in this admission as well given problem #1 and varices.  Will order spironolactone (no significant edema in lower extremities and no pleural effusions on CT lung windows)  3.  Liver lesion: Seen by GI and AFP/MRI abdomen ordered for further evaluation.  4.  Alcohol abuse: Patient was a heavy drinker but reports quitting at least a month back.  No signs of withdrawal currently.  Thiamine/multivitamin ordered.  DVT prophylaxis: SCDs  COVID screen: Negative  Code Status: Full code.Health care proxy would be wife  Patient/Family Communication: Discussed with patient and all questions answered to satisfaction.  Consults called: [facility] following Admission status :Patient will be admitted under OBSERVATION status.The patient's presenting symptoms, physical exam findings, and initial radiographic and laboratory data in the context of their medical condition is felt to place them at low risk for further clinical deterioration. Furthermore, it is anticipated that the patient will be medically stable for discharge from the  hospital within 2 midnights of hospital stay.  If abdominal pain controlled and cleared by GI after tap/MRI     Alessandra Bevels MD [facility] Pager in [place]  If 7PM-7AM, please contact night-coverage [email or web address]   02/16/2021, 6:14 PM

## 2021-02-16 NOTE — Telephone Encounter (Signed)
Please call pt's wife. She insisted on speaking with you about pt going to [facility] hospital. She can be reached at [phone]. Thank you

## 2021-02-16 NOTE — ED Triage Notes (Signed)
Pt arrived via walk in, c/o abd pain. States was seen yesterday for same, told to return to ED for paracentesis.

## 2021-02-16 NOTE — Consult Note (Signed)
Referring Provider:  EDP Primary Care Physician:  Curlene Labrum, MD Primary Gastroenterologist:  Dr. Havery Moros   Reason for Consultation:  Abdominal pain and cirrhosis  HPI: Adam Hartman is a 55 y.o. male with limited past medical history who presented as a new patient to our out patient GI office yesterday for evaluation regarding a new diagnosis of cirrhosis.  He had presented to his PCP in early April with complaints of diffuse abdominal discomfort.  He has a history of heavy alcohol use over the past 3 to 4 years, drinking 24 beers per day plus vodka daily.  Ultrasound performed at that time showed a cirrhotic appearing liver, patent portal vein, small amount of ascites, large spleen, no gallstones.  He was having severe pain in the office yesterday so was sent to the emergency department.  Upon evaluation his labs were quite unremarkable, at least stable from previous.  CT scan of the abdomen and pelvis with contrast showed the following:  IMPRESSION: Hepatic cirrhosis, and findings of portal venous hypertension including esophageal varices and splenomegaly.  Mild-to-moderate ascites.  2 cm ill-defined low-attenuation lesion in posterior right hepatic lobe. Neoplasm cannot be excluded. Abdomen MRI without and with contrast recommended for further characterization.  Aortic Atherosclerosis (ICD10-I70.0).  Since there were no acute findings on his CT scan and his pain was controlled with pain medication, he was discharged home.  His wife called our office back again this morning stating he was in severe pain, in the fetal position at home.  He was sent back to the emergency department.  Outpatient note from yesterday says that he quit drinking on 01/15/2021, but he tells me today he quit 2 months ago.  His abdominal pain has been present for the past several weeks, but progressively worsening particularly over the past couple of days.  Abdominal pain is diffuse, but worse  in the epigastrium.  He has been nauseated with frequent episodes of vomiting.  He reported yesterday that he vomited a couple of weeks ago and his wife thought that she noted some blood.  There have been no reports of black or bloody stools.  He denies NSAID use.  Here today CBC and lipase are normal.  AST is 94, ALT 42, alk phos 84, total bili 3.6.   Past Medical History:  Diagnosis Date  . Alcoholism ([place])   . Anxiety   . Ascites   . Cirrhosis ([place])   . Colon polyps   . Depression   . GERD (gastroesophageal reflux disease)   . Hypertension     Past Surgical History:  Procedure Laterality Date  . WISDOM TOOTH EXTRACTION      Prior to Admission medications   Medication Sig Start Date End Date Taking? Authorizing Provider  LORazepam (ATIVAN) 0.5 MG tablet Take 0.5 mg by mouth every 8 (eight) hours as needed for anxiety.    [provider]  pantoprazole (PROTONIX) 40 MG tablet Take 40 mg by mouth daily as needed (indigestion).    [provider]    Current Facility-Administered Medications  Medication Dose Route Frequency Provider Last Rate Last Admin  . HYDROmorphone (DILAUDID) injection 1 mg  1 mg Intramuscular Once Domenic Moras, PA-C       Current Outpatient Medications  Medication Sig Dispense Refill  . LORazepam (ATIVAN) 0.5 MG tablet Take 0.5 mg by mouth every 8 (eight) hours as needed for anxiety.    . pantoprazole (PROTONIX) 40 MG tablet Take 40 mg by mouth daily as needed (  indigestion).      Allergies as of 02/16/2021 - Review Complete 02/16/2021  Allergen Reaction Noted  . Atenolol  02/08/2021  . Codeine Itching 06/05/2012  . Wellbutrin [bupropion]  02/08/2021    Family History  Problem Relation Age of Onset  . Skin cancer Mother   . Peripheral vascular disease Father   . Stomach cancer Father   . Congenital heart disease Maternal Grandmother   . Heart attack Maternal Grandmother   . CAD Maternal Grandmother   . Dementia Maternal  Grandfather   . CAD Maternal Grandfather   . Heart attack Maternal Grandfather   . Skin cancer Maternal Grandfather   . Congenital heart disease Paternal Grandmother   . Lung cancer Paternal Grandmother   . Cancer Paternal Grandfather   . COPD Paternal Grandfather   . Colon cancer Neg Hx   . Colon polyps Neg Hx     Social History   Socioeconomic History  . Marital status: Legally Separated    Spouse name: Not on file  . Number of children: 3  . Years of education: Not on file  . Highest education level: Not on file  Occupational History  . Not on file  Tobacco Use  . Smoking status: Former Smoker    Packs/day: 3.00    Years: 15.00    Pack years: 45.00    Types: Cigarettes    Quit date: 11/24/2020    Years since quitting: 0.2  . Smokeless tobacco: Never Used  Vaping Use  . Vaping Use: Never used  Substance and Sexual Activity  . Alcohol use: Not Currently    Alcohol/week: 18.0 - 19.0 standard drinks    Types: 18 - 19 Cans of beer per week    Comment: 01/15/2021  . Drug use: No  . Sexual activity: Not on file  Other Topics Concern  . Not on file  Social History Narrative  . Not on file   Social Determinants of Health   Financial Resource Strain: Not on file  Food Insecurity: Not on file  Transportation Needs: Not on file  Physical Activity: Not on file  Stress: Not on file  Social Connections: Not on file  Intimate Partner Violence: Not on file    Review of Systems: ROS is O/W negative except as mentioned in HPI.  Physical Exam: Vital signs in last 24 hours: Temp:  [98.3 F (36.8 C)] 98.3 F (36.8 C) (05/06 1330) Pulse Rate:  [48-89] 87 (05/06 1615) Resp:  [17-22] 18 (05/06 1615) BP: (129-163)/(68-89) 163/89 (05/06 1615) SpO2:  [91 %-99 %] 96 % (05/06 1615)   General:  Alert, appears unwell, cooperative in NAD Head:  Normocephalic and atraumatic. Eyes:  Mild scleral icterus Ears:  Normal auditory acuity. Mouth:  No deformity or lesions.   Lungs:   Clear throughout to auscultation.  No wheezes, crackles, or rhonchi.  Heart:  Regular rate and rhythm; no murmurs, clicks, rubs, or gallops. Abdomen:  Soft, distended with ascites fluid.  BS present.  Diffuse TTP, but more so in the epigastrium. Msk:  Symmetrical without gross deformities. Pulses:  Normal pulses noted. Extremities:  Slight edema in B/L LEs. Neurologic:  Alert and oriented x 4;  grossly normal neurologically. Skin:  Intact without significant lesions or rashes. Psych:  Alert and cooperative. Normal mood and affect.  Lab Results: Recent Labs    02/15/21 1527 02/16/21 1349  WBC 10.9* 9.1  HGB 15.0 15.6  HCT 43.6 44.7  PLT 176 180   BMET Recent  Labs    02/15/21 1527 02/16/21 1349  NA 138 134*  K 3.8 4.0  CL 105 104  CO2 21* 21*  GLUCOSE 160* 149*  BUN 5* 5*  CREATININE 0.58* 0.62  CALCIUM 9.0 8.8*   LFT Recent Labs    02/16/21 1349  PROT 7.3  ALBUMIN 3.3*  AST 94*  ALT 42  ALKPHOS 84  BILITOT 3.6*   Studies/Results: CT ABDOMEN PELVIS W CONTRAST  Result Date: 02/15/2021 CLINICAL DATA:  Severe abdominal pain and distention. Alcoholic cirrhosis. EXAM: CT ABDOMEN AND PELVIS WITH CONTRAST TECHNIQUE: Multidetector CT imaging of the abdomen and pelvis was performed using the standard protocol following bolus administration of intravenous contrast. CONTRAST:  164m OMNIPAQUE IOHEXOL 300 MG/ML  SOLN COMPARISON:  None. FINDINGS: Lower Chest: No acute findings. Hepatobiliary: Hepatic cirrhosis is demonstrated. An ill-defined low-attenuation lesion is seen in the posterior right hepatic lobe measuring approximately 2 cm on image 43/2. A tiny cyst is seen in the anterior liver dome. Recanalization of paraumbilical veins is consistent with portal venous hypertension. Gallbladder is unremarkable. No evidence of biliary ductal dilatation. Pancreas:  No mass or inflammatory changes. Spleen: Moderate splenomegaly with length measuring 15-16 cm. No masses identified.  Adrenals/Urinary Tract: No masses identified. No evidence of ureteral calculi or hydronephrosis. Stomach/Bowel: No evidence of obstruction, inflammatory process or abnormal fluid collections. Vascular/Lymphatic: No pathologically enlarged lymph nodes. No acute vascular findings. Aortic atherosclerotic calcification noted. Abdominal portosystemic venous collaterals are seen within the abdomen including esophageal varices, consistent with portal venous hypertension. Reproductive:  No mass or other significant abnormality. Other:  Mild-to-moderate ascites seen within the abdomen pelvis. Musculoskeletal:  No suspicious bone lesions identified. IMPRESSION: Hepatic cirrhosis, and findings of portal venous hypertension including esophageal varices and splenomegaly. Mild-to-moderate ascites. 2 cm ill-defined low-attenuation lesion in posterior right hepatic lobe. Neoplasm cannot be excluded. Abdomen MRI without and with contrast recommended for further characterization. Aortic Atherosclerosis (ICD10-I70.0). Electronically Signed   By: JMarlaine HindM.D.   On: 02/15/2021 18:21   IMPRESSION:  #[id number] old white male alcoholic with new diagnosis of cirrhosis made on ultrasound 01/15/2021 with finding of a cirrhotic appearing liver, splenomegaly, and a small amount of ascites.  Patient has been drinking heavily over the past 3 to 4 years up to 24 beers per day plus vodka. He stopped drinking recently (notes said 4/4 but he told me 2 months ago). He has had gradually worsening abdominal pain over the past several weeks which is now constant and mores severe for the past 2 days, he tells me the pain is diffuse and is tender all over but primarily in the epigastrium.   CT scan yesterday did not show anything acute but he has mild to moderate ascites and a liver lesion as well as splenomegaly and esophageal varices c/w portal hypertension.  PLAN: -Patient needs admission to the hospitalist service if they would be so kind  to do so. -I have ordered PT/INR and AFP. -MRI liver ordered. -I have ordered paracentesis to be performed by IR with fluid studies to rule out SBP. -May need EGD this admission as well but it will not be on Saturday.  JLaban Emperor Taydon Nasworthy  02/16/2021, 4:35 PM

## 2021-02-16 NOTE — Telephone Encounter (Signed)
Spoke with pts wife and she states pt is at the hospital. She wanted to know what the status was, let her know Shanda Bumps our PA has consulted on him and the plan is for him to be admitted. She thanked me for the call.

## 2021-02-16 NOTE — Telephone Encounter (Signed)
Pt saw Amy yesterday and was sent to the ER, he was in a lot of pain and it was planned for him to be admitted. Pts wife states pt was given dilaudid in the er for the pain and it helped him. He had a CT scan and was told there was a massive amt of ascites. At that point he had no pain and he was discharged and told to follow-up with GI in the am and have a paracentesis as an outpt. Wife states pt tried to question not being admitted but he was told he did not need an admission. Pt woke up at 3am in terrible pain. Wife states he is in the fetal position and crying and she does not know what to do. Dr. Adela Lank it looks like you reviewed his note as you were supervising doc yesterday. Please advise.

## 2021-02-16 NOTE — ED Provider Notes (Signed)
Emergency Medicine Provider Triage Evaluation Note  Adam Hartman , a 55 y.o. male  was evaluated in triage.  Pt complains of abd pain.  Review of Systems  Positive: abd pain, nausea Negative: Fever, cp, sob, dysuria  Physical Exam  BP (!) 152/89 (BP Location: Left Arm)   Pulse 82   Temp 98.3 F (36.8 C) (Oral)   Resp (!) 22   SpO2 99%  Gen:   Awake, appears uncomfortable Resp:  tachypneic MSK:   Moves extremities without difficulty  Other:  Tenderness to RUQ and epigastric, distended abdomen  Medical Decision Making  Medically screening exam initiated at 1:48 PM.  Appropriate orders placed.  Adam Hartman was informed that the remainder of the evaluation will be completed by another provider, this initial triage assessment does not replace that evaluation, and the importance of remaining in the ED until their evaluation is complete.  Hx of alcohol related cirrhosis here with worsening upper abd pain since yesterday.  Was seen yesterday for same, now pain worsen.  Will recheck belly labs. IM dilaudid given   Fayrene Helper, PA-C 02/16/21 1350    Linwood Dibbles, MD 02/17/21 [id number]

## 2021-02-17 ENCOUNTER — Observation Stay (HOSPITAL_COMMUNITY): Payer: Self-pay

## 2021-02-17 DIAGNOSIS — K769 Liver disease, unspecified: Secondary | ICD-10-CM

## 2021-02-17 DIAGNOSIS — G9341 Metabolic encephalopathy: Secondary | ICD-10-CM | POA: Diagnosis present

## 2021-02-17 DIAGNOSIS — R1084 Generalized abdominal pain: Secondary | ICD-10-CM

## 2021-02-17 LAB — COMPREHENSIVE METABOLIC PANEL
ALT: 40 U/L (ref 0–44)
ALT: 41 U/L (ref 0–44)
AST: 86 U/L — ABNORMAL HIGH (ref 15–41)
AST: 86 U/L — ABNORMAL HIGH (ref 15–41)
Albumin: 3.4 g/dL — ABNORMAL LOW (ref 3.5–5.0)
Albumin: 3.3 g/dL — ABNORMAL LOW (ref 3.5–5.0)
Alkaline Phosphatase: 80 U/L (ref 38–126)
Alkaline Phosphatase: 75 U/L (ref 38–126)
Anion gap: 11 (ref 5–15)
Anion gap: 10 (ref 5–15)
BUN: 7 mg/dL (ref 6–20)
BUN: 9 mg/dL (ref 6–20)
CO2: 23 mmol/L (ref 22–32)
CO2: 22 mmol/L (ref 22–32)
Calcium: 8.8 mg/dL — ABNORMAL LOW (ref 8.9–10.3)
Calcium: 8.7 mg/dL — ABNORMAL LOW (ref 8.9–10.3)
Chloride: 101 mmol/L (ref 98–111)
Chloride: 100 mmol/L (ref 98–111)
Creatinine, Ser: 0.83 mg/dL (ref 0.61–1.24)
Creatinine, Ser: 0.89 mg/dL (ref 0.61–1.24)
GFR, Estimated: >60 mL/min (ref >60)
GFR, Estimated: >60 mL/min (ref >60)
Glucose, Bld: 146 mg/dL — ABNORMAL HIGH (ref 70–99)
Glucose, Bld: 185 mg/dL — ABNORMAL HIGH (ref 70–99)
Potassium: 4.4 mmol/L (ref 3.5–5.1)
Potassium: 4.6 mmol/L (ref 3.5–5.1)
Sodium: 135 mmol/L (ref 135–145)
Sodium: 132 mmol/L — ABNORMAL LOW (ref 135–145)
Total Bilirubin: 3.5 mg/dL — ABNORMAL HIGH (ref 0.3–1.2)
Total Bilirubin: 3.9 mg/dL — ABNORMAL HIGH (ref 0.3–1.2)
Total Protein: 7.7 g/dL (ref 6.5–8.1)
Total Protein: 7.2 g/dL (ref 6.5–8.1)

## 2021-02-17 LAB — CBC
HCT: 47.1 % (ref 39.0–52.0)
HCT: 44.4 % (ref 39.0–52.0)
Hemoglobin: 15.5 g/dL (ref 13.0–17.0)
Hemoglobin: 14.7 g/dL (ref 13.0–17.0)
MCH: 33.9 pg (ref 26.0–34.0)
MCH: 33.6 pg (ref 26.0–34.0)
MCHC: 32.9 g/dL (ref 30.0–36.0)
MCHC: 33.1 g/dL (ref 30.0–36.0)
MCV: 103.1 fL — ABNORMAL HIGH (ref 80.0–100.0)
MCV: 101.6 fL — ABNORMAL HIGH (ref 80.0–100.0)
Platelets: 192 10*3/uL (ref 150–400)
Platelets: 175 10*3/uL (ref 150–400)
RBC: 4.57 MIL/uL (ref 4.22–5.81)
RBC: 4.37 MIL/uL (ref 4.22–5.81)
RDW: 14.8 % (ref 11.5–15.5)
RDW: 14.6 % (ref 11.5–15.5)
WBC: 17.3 10*3/uL — ABNORMAL HIGH (ref 4.0–10.5)
WBC: 19.2 10*3/uL — ABNORMAL HIGH (ref 4.0–10.5)
nRBC: 0 % (ref 0.0–0.2)
nRBC: 0 % (ref 0.0–0.2)

## 2021-02-17 LAB — CULTURE, BLOOD (ROUTINE X 2): Special Requests: ADEQUATE

## 2021-02-17 LAB — BODY FLUID CELL COUNT WITH DIFFERENTIAL
Eos, Fluid: 0 %
Lymphs, Fluid: 14 %
Monocyte-Macrophage-Serous Fluid: 64 % (ref 50–90)
Neutrophil Count, Fluid: 22 % (ref 0–25)
Total Nucleated Cell Count, Fluid: 413 cu mm (ref 0–1000)

## 2021-02-17 LAB — AMMONIA: Ammonia: 95 umol/L — ABNORMAL HIGH (ref 9–35)

## 2021-02-17 LAB — GRAM STAIN

## 2021-02-17 LAB — ALBUMIN, PLEURAL OR PERITONEAL FLUID: Albumin, Fluid: 1.2 g/dL

## 2021-02-17 LAB — MRSA PCR SCREENING: MRSA by PCR: NEGATIVE

## 2021-02-17 LAB — GLUCOSE, CAPILLARY: Glucose-Capillary: 190 mg/dL — ABNORMAL HIGH (ref 70–99)

## 2021-02-17 LAB — HIV ANTIBODY (ROUTINE TESTING W REFLEX): HIV Screen 4th Generation wRfx: NONREACTIVE

## 2021-02-17 MED ORDER — LIDOCAINE HCL 1 % IJ SOLN
INTRAMUSCULAR | Status: AC
  Filled 2021-02-17: qty 10

## 2021-02-17 MED ORDER — HYDROMORPHONE HCL 1 MG/ML IJ SOLN
0.5000 mg | INTRAMUSCULAR | Status: DC | PRN
  Administered 2021-02-17 – 2021-02-19 (×6): 0.5 mg via INTRAVENOUS
  Filled 2021-02-17: qty 0.5
  Filled 2021-02-17: qty 1
  Filled 2021-02-17: qty 0.5
  Filled 2021-02-17: qty 1
  Filled 2021-02-17: qty 0.5
  Filled 2021-02-17: qty 1

## 2021-02-17 MED ORDER — NALOXONE HCL 0.4 MG/ML IJ SOLN
INTRAMUSCULAR | Status: AC
  Filled 2021-02-17: qty 1

## 2021-02-17 MED ORDER — LORAZEPAM 2 MG/ML IJ SOLN
INTRAMUSCULAR | Status: AC
  Filled 2021-02-17: qty 1

## 2021-02-17 MED ORDER — CHLORHEXIDINE GLUCONATE CLOTH 2 % EX PADS
6.0000 | MEDICATED_PAD | Freq: Every day | CUTANEOUS | Status: DC
  Administered 2021-02-17 – 2021-02-19 (×2): 6 via TOPICAL

## 2021-02-17 MED ORDER — NALOXONE HCL 0.4 MG/ML IJ SOLN
INTRAMUSCULAR | Status: AC
  Administered 2021-02-17: 0.4 mg via INTRAVENOUS
  Filled 2021-02-17: qty 1

## 2021-02-17 NOTE — Progress Notes (Signed)
[facility] Progress Note  CC:  Abdominal pain and cirrhosis  Subjective:  Got transferred to step down for tachycardia and being unresponsive.  Was given narcan and improved.  Still confused and rather unaware of events.  Says that he is feeling better today.  Awaiting paracentesis.  Objective:  Vital signs in last 24 hours: Temp:  [97.9 F (36.6 C)-98.9 F (37.2 C)] 98.9 F (37.2 C) (05/07 0655) Pulse Rate:  [70-110] 110 (05/07 0655) Resp:  [17-22] 20 (05/07 0655) BP: (119-164)/(69-96) 129/80 (05/07 0655) SpO2:  [91 %-99 %] 96 % (05/07 0655) Last BM Date: 02/15/21 General:  Alert, ill-appearing, in NAD Heart:  Tachy, no M/R/G. Pulm:  CTAB.  No W/R/R. Abdomen:  Soft, distended with ascites fluid.  BS present.  Non-tender. Extremities:  Trace edema in B/L LEs.  Intake/Output from previous day: 05/06 0701 - 05/07 0700 In: 600 [P.O.:600] Out: -   Lab Results: Recent Labs    02/15/21 1527 02/16/21 1349 02/17/21 0527  WBC 10.9* 9.1 17.3*  HGB 15.0 15.6 15.5  HCT 43.6 44.7 47.1  PLT 176 180 192   BMET Recent Labs    02/15/21 1527 02/16/21 1349 02/17/21 0527  NA 138 134* 135  K 3.8 4.0 4.4  CL 105 104 101  CO2 21* 21* 23  GLUCOSE 160* 149* 146*  BUN 5* 5* 7  CREATININE 0.58* 0.62 0.83  CALCIUM 9.0 8.8* 8.8*   LFT Recent Labs    02/17/21 0527  PROT 7.7  ALBUMIN 3.4*  AST 86*  ALT 40  ALKPHOS 80  BILITOT 3.5*   PT/INR Recent Labs    02/16/21 1721  LABPROT 19.0*  INR 1.6*   CT ABDOMEN PELVIS W CONTRAST  Result Date: 02/15/2021 CLINICAL DATA:  Severe abdominal pain and distention. Alcoholic cirrhosis. EXAM: CT ABDOMEN AND PELVIS WITH CONTRAST TECHNIQUE: Multidetector CT imaging of the abdomen and pelvis was performed using the standard protocol following bolus administration of intravenous contrast. CONTRAST:  OMNIPAQUE IOHEXOL 300 MG/ML  SOLN COMPARISON:  None. FINDINGS: Lower Chest: No acute findings. Hepatobiliary: Hepatic  cirrhosis is demonstrated. An ill-defined low-attenuation lesion is seen in the posterior right hepatic lobe measuring approximately 2 cm on image 43/2. A tiny cyst is seen in the anterior liver dome. Recanalization of paraumbilical veins is consistent with portal venous hypertension. Gallbladder is unremarkable. No evidence of biliary ductal dilatation. Pancreas:  No mass or inflammatory changes. Spleen: Moderate splenomegaly with length measuring 15-16 cm. No masses identified. Adrenals/Urinary Tract: No masses identified. No evidence of ureteral calculi or hydronephrosis. Stomach/Bowel: No evidence of obstruction, inflammatory process or abnormal fluid collections. Vascular/Lymphatic: No pathologically enlarged lymph nodes. No acute vascular findings. Aortic atherosclerotic calcification noted. Abdominal portosystemic venous collaterals are seen within the abdomen including esophageal varices, consistent with portal venous hypertension. Reproductive:  No mass or other significant abnormality. Other:  Mild-to-moderate ascites seen within the abdomen pelvis. Musculoskeletal:  No suspicious bone lesions identified. IMPRESSION: Hepatic cirrhosis, and findings of portal venous hypertension including esophageal varices and splenomegaly. Mild-to-moderate ascites. 2 cm ill-defined low-attenuation lesion in posterior right hepatic lobe. Neoplasm cannot be excluded. Abdomen MRI without and with contrast recommended for further characterization. Aortic Atherosclerosis (ICD10-I70.0). Electronically Signed   By: Danae Orleans M.D.   On: 02/15/2021 18:21   MR LIVER W WO CONTRAST  Result Date: 02/16/2021 CLINICAL DATA:  Abdominal pain. History of cirrhosis and portal hypertension. Liver lesion on CT EXAM: MRI ABDOMEN WITHOUT AND WITH CONTRAST TECHNIQUE: Multiplanar  multisequence MR imaging of the abdomen was performed both before and after the administration of intravenous contrast. CONTRAST:  59mL GADAVIST GADOBUTROL 1  MMOL/ML IV SOLN COMPARISON:  Abdominopelvic CT 02/15/2021. Abdominal ultrasound 01/19/2021. FINDINGS: Image quality is mildly degraded by motion artifact. Lower chest: The visualized lower chest demonstrates no significant findings. Hepatobiliary: Mild hepatomegaly with the liver measuring up to 24 cm in height. There are mild morphologic changes of cirrhosis. There is a simple cyst anteriorly in the dome of the right hepatic lobe which measures 1.4 cm on image 6/11. There are additional tiny cysts. The lesion of concern inferiorly in the right hepatic lobe corresponds with an ill-defined area of mildly increased T2 signal measuring 1.8 cm on image 40/11. No arterial phase enhancement of this lesion is seen, although there is delayed ring enhancement with a small central area of nonenhancement measuring 7 mm on image 71/25. No other focal liver lesions are identified. No evidence of gallstones, gallbladder wall thickening or biliary dilatation. Pancreas: Unremarkable. No pancreatic ductal dilatation or surrounding inflammatory changes. Spleen: Mild to moderate splenomegaly without focal abnormality. Adrenals/Urinary Tract: Both adrenal glands appear normal. No suspicious renal findings. No evidence of renal mass or hydronephrosis. Stomach/Bowel: The stomach appears unremarkable for its degree of distension. No evidence of bowel wall thickening, distention or surrounding inflammatory change. Vascular/Lymphatic: Several prominent lymph nodes in the porta hepatis are similar to recent CT and likely reactive given the patient's cirrhosis. No retroperitoneal lymphadenopathy. No acute vascular findings identified. Gastric and distal esophageal varices again noted consistent with portal hypertension. The portal, superior mesenteric and splenic veins are patent. Other: Mild-to-moderate ascites. Musculoskeletal: No acute or significant osseous findings. IMPRESSION: 1. The previously demonstrated lesion of concern inferiorly  in the right hepatic lobe has nonspecific MR features with delayed ring enhancement and ill-defined T2 signal. These are not the typical features of hepatocellular carcinoma, although non-HCC malignancy such as intrahepatic cholangiocarcinoma or metastatic disease cannot be excluded. Recommend MR follow-up in 3 months. This lesion is likely too small to evaluate or biopsy by ultrasound. 2. Morphologic changes of cirrhosis with secondary signs of portal hypertension. Mild-to-moderate ascites. 3. Prominent lymph nodes in the porta hepatis, likely reactive. Electronically Signed   By: Carey Bullocks M.D.   On: 02/16/2021 19:58   Assessment / Plan: #76 55 year old white male alcoholic with new diagnosis of cirrhosis made on ultrasound 01/15/2021 with finding of a cirrhotic appearing liver, splenomegaly, and a small amount of ascites. Patient has been drinking heavily over the past 3 to 4 years up to 24 beers per day plus vodka. He stopped drinking recently (notes said 4/4 but he told me 2 months ago). He has had gradually worsening abdominal pain over the past several weeks which is now constant and mores severe for the past 2 days, he tells me the pain is diffuse and is tender all over but primarily in the epigastrium.   CT scan yesterday did not show anything acute but he has mild to moderate ascites and a liver lesion as well as splenomegaly and esophageal varices c/w portal hypertension. MRI findings not typical for HCC, but intrahepatic cholangiocarcinoma, etc cannot be excluded.  Not amenable to biopsy so repeat imaging with MRI recommended in 3 months.  AFP pending. Awaiting paracentesis.  WBC count up today. MELD Na is 18. EGD at some point.   LOS: 0 days   Princella Pellegrini. Nilsa Macht  02/17/2021, 8:56 AM

## 2021-02-17 NOTE — Progress Notes (Signed)
Was stat paged to pt's rm to ck on pt, upon entering rm pt noted to be very pale, diaphoretic, unresponsive to pain, had loud/snoring/agonal respirations. Pt w no overt seizure activity but noted extremeties very stiff and clenched fists, pupils pinpoint. O2 per [place] up'ed to 15 L, rapid response team called. MD at bedside.

## 2021-02-17 NOTE — Procedures (Signed)
   [place] LLQ paracentesis  2.1 L dark yellow fluid Tolerated well  Sent for labs per MD  EBL: None

## 2021-02-17 NOTE — Significant Event (Signed)
Rapid Response Event Note   Reason for Call :  Pt unresponsive  Initial Focused Assessment:  Pt unresponsive in bed. Snoring and Diaphoretic. O2 sats low 90s on RA. ST 130s. Pt not responsive to painful stimuli. Clenched fists and stiff upper extremities while unresponsive. Pupils pinpoint. No abnormal eye movement.  MD present at bedside. It was noted that pt is a drinker and had received 2mg  IV dilaudid at 0646.    Interventions:  PIV narcan administered. Pt alert and confused within the minute after administration. Tremors present.  Plan of Care:  Pt transferred to SDU room 1233 for closer monitoring.    Event Summary:  Pt unresponsive on floor after receiving a 2mg  dose of IV dilaudid @ 0646. MD at bedside. Rapid response called. 0.4mg  IV Narcan administered. Pt responsive, with tremors, and confused. Transferred to 1233 SDU for closer monitoring.  MD Notified: present at bedside Call Time: 0852 Arrival Time: 0854 End Time: 0913  0853, RN

## 2021-02-17 NOTE — Progress Notes (Signed)
PROGRESS NOTE    Adam Hartman  WEX:[id number] DOB: 1966/05/24 DOA: 02/16/2021 PCP: Juliette Alcide, MD   Brief Narrative:  Adam Hartman is a 55 y.o. male with history h/o hypertension, GERD, heavy alcohol use, anxiety/depression presented to GI clinic week earlier  with complaints of abdominal pain-diffuse with alcohol use until recently (quit more than  a month back).  Ultrasonogram performed in the clinic showed liver cirrhosis with small amount of ascites, splenomegaly.  Given complaints of severe pain he was referred to the ED-CT abdomen/pelvis obtained showing hepatic cirrhosis/mild to moderate ascites and findings consistent with portal venous hypertension, esophageal varices and splenomegaly.  Also reported on CT was a 2 cm right hepatic lobe ill-defined lesion, neoplasm could not be excluded per radiology and abdominal MRI recommended for further characterization.  Discharged home and ultimately presented back to the ED with recurrent severe abdominal pain this morning associated with nausea/dry heaving.  Hospitalist called for admission for pain control and further evaluation, GI consulted for assistance.  Assessment & Plan:   Principal Problem:   Abdominal pain Active Problems:   Alcohol abuse   Ascites   GERD (gastroesophageal reflux disease)   Cirrhosis (HCC)   Portal hypertension (HCC)   Secondary esophageal varices without bleeding (HCC)  Acute toxic encephalopathy, resolved  Cannot rule out concurrent metabolic encephalopathy in the setting of hyperammonemia -Patient nonresponsive this morning at bedside upon evaluation, gray diaphoretic tachypneic with poor respiratory status, apneic. -Rapid response called, patient resolved quite quickly with Narcan likely secondary to toxic encephalopathy given his narcotic naivety -We will resume narcotics at much lower dose, pain appears to be well controlled at this time -Patient does have concurrently elevated ammonia  although given his response to Narcan at his primary source of mental status changes was likely due to narcotics -There was also some concern for alcohol withdrawal and possible postictal state however he has not reported drinking for some 2 months now and given resolution of symptoms with Narcan and again unlikely to be withdrawal related.  Acute intractable abdominal pain, Unclear etiology.  - Pain initially well controlled on Dilaudid, patient unresponsive this morning, likely in the setting of overmedication, will need to monitor patient's narcotic intake given this morning's event - CT abdomen findings as above with ill-defined hepatic lesion, ascites - GI following, appreciate insight and recommendations - Paracentesis pending, MRI of the liver shows similar lesion, will follow outpatient for repeat imaging per GI - Awaiting paracentesis fluid collection given MRI is not amenable to biopsy.  Will likely need an EGD in the near future per GI  Liver cirrhosis and unspecified liver lesion, and both are new diagnoses, POA - Complicated by splenomegaly, portal hypertension, ascites and esophageal varices.   - GI following - Pending AFP, diagnostic paracentesis - Per MRI "not the typical features of hepatocellular carcinoma, although non-HCC malignancy such as intrahepatic cholangiocarcinoma or metastatic disease cannot be excluded. Recommend MR follow-up in 3 months. This lesion is likely too small to evaluate or biopsy by ultrasound"  Likely chronic sleep apnea, undiagnosed  -Patient was profoundly apneic this morning in the setting of above, lengthy discussion reveals he had been set up for sleep study on multiple occasions but never followed up, will offer nighttime oxygen/positive pressure as indicated.  Chronic alcohol abuse: Patient was a heavy drinker but reports quitting at least a month back (reports 2 month to myself today).  No signs of withdrawal currently.  Thiamine/multivitamin  ordered.  DVT prophylaxis: SCDs Code Status:  Full Family Communication: None present  Status is: Inpatient   Dispo: The patient is from: Home              Anticipated d/c is to: Home              Anticipated d/c date is: 48 to 72 hours              Patient currently not medically stable for discharge  Consultants:   GI  Procedures:   Paracentesis 02/17/2021  Antimicrobials:  None  Subjective: No acute events overnight, early this morning patient noted to be unresponsive, rapid response with labs, vitals at bedside essentially unremarkable other than minimally elevated ammonia, patient noted to have increased dosing of Dilaudid overnight, after Narcan administration patient responded appropriately, alert and oriented x4 on repeat evaluation later this morning.  Patient on reevaluation denies nausea vomiting diarrhea constipation headache fevers chills chest pain shortness of breath.  Abdominal pain continues to be well controlled despite Narcan.  Objective: Vitals:   02/16/21 2000 02/16/21 2100 02/16/21 2119 02/17/21 0139  BP: 138/81 119/75 128/85 (!) 143/87  Pulse: 71 82 81 90  Resp: (!) 22 20 18 18   Temp:  98.5 F (36.9 C) 98.7 F (37.1 C) 98.3 F (36.8 C)  TempSrc:  Oral Oral Oral  SpO2: 97% 96% 97% 94%    Intake/Output Summary (Last 24 hours) at 02/17/2021 04/19/2021 Last data filed at 02/17/2021 0400 Gross per 24 hour  Intake 600 ml  Output --  Net 600 ml   There were no vitals filed for this visit.  Examination:  General exam: Appears calm and comfortable  Respiratory system: Clear to auscultation. Respiratory effort normal. Cardiovascular system: S1 & S2 heard, RRR. No JVD, murmurs, rubs, gallops or clicks. No pedal edema. Gastrointestinal system: Abdomen is nondistended, soft and nontender. No organomegaly or masses felt. Normal bowel sounds heard. Central nervous system: Alert and oriented. No focal neurological deficits. Extremities: Symmetric 5 x 5  power. Skin: No rashes, lesions or ulcers Psychiatry: Judgement and insight appear normal. Mood & affect appropriate.     Data Reviewed: I have personally reviewed following labs and imaging studies  CBC: Recent Labs  Lab 02/15/21 1527 02/16/21 1349 02/17/21 0527  WBC 10.9* 9.1 17.3*  NEUTROABS 8.9* 6.7  --   HGB 15.0 15.6 15.5  HCT 43.6 44.7 47.1  MCV 98.2 97.2 103.1*  PLT 176 180 192   Basic Metabolic Panel: Recent Labs  Lab 02/15/21 1527 02/16/21 1349  NA 138 134*  K 3.8 4.0  CL 105 104  CO2 21* 21*  GLUCOSE 160* 149*  BUN 5* 5*  CREATININE 0.58* 0.62  CALCIUM 9.0 8.8*   GFR: Estimated Creatinine Clearance: 140.6 mL/min (by C-G formula based on SCr of 0.62 mg/dL). Liver Function Tests: Recent Labs  Lab 02/15/21 1527 02/16/21 1349  AST 106* 94*  ALT 44 42  ALKPHOS 110 84  BILITOT 3.0* 3.6*  PROT 7.3 7.3  ALBUMIN 3.4* 3.3*   Recent Labs  Lab 02/15/21 1527 02/16/21 1349  LIPASE 42 43   Recent Labs  Lab 02/15/21 1527  AMMONIA 31   Coagulation Profile: Recent Labs  Lab 02/16/21 1721  INR 1.6*   Cardiac Enzymes: No results for input(s): CKTOTAL, CKMB, CKMBINDEX, TROPONINI in the last 168 hours. BNP (last 3 results) No results for input(s): PROBNP in the last 8760 hours. HbA1C: No results for input(s): HGBA1C in the last 72 hours. CBG: No results for input(s): GLUCAP  in the last 168 hours. Lipid Profile: No results for input(s): CHOL, HDL, LDLCALC, TRIG, CHOLHDL, LDLDIRECT in the last 72 hours. Thyroid Function Tests: No results for input(s): TSH, T4TOTAL, FREET4, T3FREE, THYROIDAB in the last 72 hours. Anemia Panel: No results for input(s): VITAMINB12, FOLATE, FERRITIN, TIBC, IRON, RETICCTPCT in the last 72 hours. Sepsis Labs: Recent Labs  Lab 02/15/21 1554 02/15/21 1754  LATICACIDVEN 2.9* 1.9    Recent Results (from the past 240 hour(s))  SARS CORONAVIRUS 2 (TAT 6-24 HRS) Nasopharyngeal Nasopharyngeal Swab     Status: None    Collection Time: 02/15/21  3:28 PM   Specimen: Nasopharyngeal Swab  Result Value Ref Range Status   SARS Coronavirus 2 NEGATIVE NEGATIVE Final    Comment: (NOTE) SARS-CoV-2 target nucleic acids are NOT DETECTED.  The SARS-CoV-2 RNA is generally detectable in upper and lower respiratory specimens during the acute phase of infection. Negative results do not preclude SARS-CoV-2 infection, do not rule out co-infections with other pathogens, and should not be used as the sole basis for treatment or other patient management decisions. Negative results must be combined with clinical observations, patient history, and epidemiological information. The expected result is Negative.  Fact Sheet for Patients: HairSlick.nohttps://www.fda.gov/media/138098/download  Fact Sheet for Healthcare Providers: quierodirigir.comhttps://www.fda.gov/media/138095/download  This test is not yet approved or cleared by the [place] FDA and  has been authorized for detection and/or diagnosis of SARS-CoV-2 by FDA under an Emergency Use Authorization (EUA). This EUA will remain  in effect (meaning this test can be used) for the duration of the COVID-19 declaration under Se ction 564(b)(1) of the Act, 21 U.S.C. section 360bbb-3(b)(1), unless the authorization is terminated or revoked sooner.  Performed at [facility], [address] [place], [place] [address]   Blood culture (routine x 2)     Status: None (Preliminary result)   Collection Time: 02/15/21  3:54 PM   Specimen: Site Not Specified; Blood  Result Value Ref Range Status   Specimen Description   Final    SITE NOT SPECIFIED Performed at [facility], [address] [place], [place] [address]    Special Requests   Final    BOTTLES DRAWN AEROBIC AND ANAEROBIC Blood Culture adequate volume Performed at [facility], [address] [place], [place] [address]    Culture   Final    NO GROWTH < 12 HOURS Performed at [facility], [address] [place], [place] [address]    Report Status PENDING  Incomplete  Blood culture (routine x 2)     Status: None (Preliminary result)   Collection Time: 02/15/21  4:03 PM   Specimen: Site Not Specified; Blood  Result Value Ref Range Status   Specimen Description   Final    SITE NOT SPECIFIED Performed at [facility], [address] [place], [place] [address]    Special Requests   Final    BOTTLES DRAWN AEROBIC AND ANAEROBIC Blood Culture adequate volume Performed at [facility], [address] [place], [place] [address]    Culture   Final    NO GROWTH < 12 HOURS Performed at [facility], [address] [place], [place] [address]    Report Status PENDING  Incomplete         Radiology Studies: CT ABDOMEN PELVIS W CONTRAST  Result Date: 02/15/2021 CLINICAL DATA:  Severe abdominal pain and distention. Alcoholic cirrhosis. EXAM: CT ABDOMEN AND PELVIS WITH  CONTRAST TECHNIQUE: Multidetector CT imaging of the abdomen and pelvis was performed using the standard protocol following bolus administration of intravenous contrast. CONTRAST:  OMNIPAQUE IOHEXOL 300 MG/ML  SOLN COMPARISON:  None. FINDINGS: Lower Chest: No acute findings. Hepatobiliary: Hepatic cirrhosis is demonstrated. An ill-defined low-attenuation lesion is seen in the posterior right hepatic lobe measuring approximately 2 cm on image 43/2. A tiny cyst is seen in the anterior liver dome. Recanalization of paraumbilical veins is consistent with portal venous hypertension. Gallbladder is unremarkable. No evidence of biliary ductal dilatation. Pancreas:  No mass or inflammatory changes. Spleen: Moderate splenomegaly with length measuring 15-16 cm. No masses identified. Adrenals/Urinary Tract: No masses identified. No evidence of ureteral calculi or hydronephrosis. Stomach/Bowel: No evidence of obstruction, inflammatory process or abnormal fluid collections.  Vascular/Lymphatic: No pathologically enlarged lymph nodes. No acute vascular findings. Aortic atherosclerotic calcification noted. Abdominal portosystemic venous collaterals are seen within the abdomen including esophageal varices, consistent with portal venous hypertension. Reproductive:  No mass or other significant abnormality. Other:  Mild-to-moderate ascites seen within the abdomen pelvis. Musculoskeletal:  No suspicious bone lesions identified. IMPRESSION: Hepatic cirrhosis, and findings of portal venous hypertension including esophageal varices and splenomegaly. Mild-to-moderate ascites. 2 cm ill-defined low-attenuation lesion in posterior right hepatic lobe. Neoplasm cannot be excluded. Abdomen MRI without and with contrast recommended for further characterization. Aortic Atherosclerosis (ICD10-I70.0). Electronically Signed   By: Danae Orleans M.D.   On: 02/15/2021 18:21   MR LIVER W WO CONTRAST  Result Date: 02/16/2021 CLINICAL DATA:  Abdominal pain. History of cirrhosis and portal hypertension. Liver lesion on CT EXAM: MRI ABDOMEN WITHOUT AND WITH CONTRAST TECHNIQUE: Multiplanar multisequence MR imaging of the abdomen was performed both before and after the administration of intravenous contrast. CONTRAST:  11mL GADAVIST GADOBUTROL 1 MMOL/ML IV SOLN COMPARISON:  Abdominopelvic CT 02/15/2021. Abdominal ultrasound 01/19/2021. FINDINGS: Image quality is mildly degraded by motion artifact. Lower chest: The visualized lower chest demonstrates no significant findings. Hepatobiliary: Mild hepatomegaly with the liver measuring up to 24 cm in height. There are mild morphologic changes of cirrhosis. There is a simple cyst anteriorly in the dome of the right hepatic lobe which measures 1.4 cm on image 6/11. There are additional tiny cysts. The lesion of concern inferiorly in the right hepatic lobe corresponds with an ill-defined area of mildly increased T2 signal measuring 1.8 cm on image 40/11. No arterial phase  enhancement of this lesion is seen, although there is delayed ring enhancement with a small central area of nonenhancement measuring 7 mm on image 71/25. No other focal liver lesions are identified. No evidence of gallstones, gallbladder wall thickening or biliary dilatation. Pancreas: Unremarkable. No pancreatic ductal dilatation or surrounding inflammatory changes. Spleen: Mild to moderate splenomegaly without focal abnormality. Adrenals/Urinary Tract: Both adrenal glands appear normal. No suspicious renal findings. No evidence of renal mass or hydronephrosis. Stomach/Bowel: The stomach appears unremarkable for its degree of distension. No evidence of bowel wall thickening, distention or surrounding inflammatory change. Vascular/Lymphatic: Several prominent lymph nodes in the porta hepatis are similar to recent CT and likely reactive given the patient's cirrhosis. No retroperitoneal lymphadenopathy. No acute vascular findings identified. Gastric and distal esophageal varices again noted consistent with portal hypertension. The portal, superior mesenteric and splenic veins are patent. Other: Mild-to-moderate ascites. Musculoskeletal: No acute or significant osseous findings. IMPRESSION: 1. The previously demonstrated lesion of concern inferiorly in the right hepatic lobe has nonspecific MR features with delayed ring enhancement and ill-defined T2 signal. These are not the typical  features of hepatocellular carcinoma, although non-HCC malignancy such as intrahepatic cholangiocarcinoma or metastatic disease cannot be excluded. Recommend MR follow-up in 3 months. This lesion is likely too small to evaluate or biopsy by ultrasound. 2. Morphologic changes of cirrhosis with secondary signs of portal hypertension. Mild-to-moderate ascites. 3. Prominent lymph nodes in the porta hepatis, likely reactive. Electronically Signed   By: Carey Bullocks M.D.   On: 02/16/2021 19:58    Scheduled Meds: . pantoprazole (PROTONIX)  IV  40 mg Intravenous Q12H  . spironolactone  25 mg Oral Daily  . thiamine  100 mg Oral Daily    LOS: 0 days   Time spent: 55 min  Azucena Fallen, DO [facility]  If 7PM-7AM, please contact night-coverage [email or web address]  02/17/2021, 7:05 AM

## 2021-02-18 DIAGNOSIS — K7031 Alcoholic cirrhosis of liver with ascites: Principal | ICD-10-CM

## 2021-02-18 DIAGNOSIS — K769 Liver disease, unspecified: Secondary | ICD-10-CM

## 2021-02-18 DIAGNOSIS — K766 Portal hypertension: Secondary | ICD-10-CM

## 2021-02-18 DIAGNOSIS — G9341 Metabolic encephalopathy: Secondary | ICD-10-CM

## 2021-02-18 DIAGNOSIS — R932 Abnormal findings on diagnostic imaging of liver and biliary tract: Secondary | ICD-10-CM

## 2021-02-18 DIAGNOSIS — F1010 Alcohol abuse, uncomplicated: Secondary | ICD-10-CM

## 2021-02-18 LAB — COMPREHENSIVE METABOLIC PANEL
ALT: 33 U/L (ref 0–44)
AST: 63 U/L — ABNORMAL HIGH (ref 15–41)
Albumin: 2.8 g/dL — ABNORMAL LOW (ref 3.5–5.0)
Alkaline Phosphatase: 68 U/L (ref 38–126)
Anion gap: 7 (ref 5–15)
BUN: 10 mg/dL (ref 6–20)
CO2: 25 mmol/L (ref 22–32)
Calcium: 8.4 mg/dL — ABNORMAL LOW (ref 8.9–10.3)
Chloride: 101 mmol/L (ref 98–111)
Creatinine, Ser: 0.69 mg/dL (ref 0.61–1.24)
GFR, Estimated: >60 mL/min (ref >60)
Glucose, Bld: 114 mg/dL — ABNORMAL HIGH (ref 70–99)
Potassium: 4.2 mmol/L (ref 3.5–5.1)
Sodium: 133 mmol/L — ABNORMAL LOW (ref 135–145)
Total Bilirubin: 3 mg/dL — ABNORMAL HIGH (ref 0.3–1.2)
Total Protein: 6.7 g/dL (ref 6.5–8.1)

## 2021-02-18 LAB — CBC
HCT: 38.2 % — ABNORMAL LOW (ref 39.0–52.0)
Hemoglobin: 12.9 g/dL — ABNORMAL LOW (ref 13.0–17.0)
MCH: 33.8 pg (ref 26.0–34.0)
MCHC: 33.8 g/dL (ref 30.0–36.0)
MCV: 100 fL (ref 80.0–100.0)
Platelets: 137 10*3/uL — ABNORMAL LOW (ref 150–400)
RBC: 3.82 MIL/uL — ABNORMAL LOW (ref 4.22–5.81)
RDW: 14.7 % (ref 11.5–15.5)
WBC: 11.8 10*3/uL — ABNORMAL HIGH (ref 4.0–10.5)
nRBC: 0 % (ref 0.0–0.2)

## 2021-02-18 LAB — AMMONIA: Ammonia: 55 umol/L — ABNORMAL HIGH (ref 9–35)

## 2021-02-18 LAB — CULTURE, BLOOD (ROUTINE X 2): Special Requests: ADEQUATE

## 2021-02-18 MED ORDER — PANTOPRAZOLE SODIUM 40 MG PO TBEC
40.0000 mg | DELAYED_RELEASE_TABLET | Freq: Every day | ORAL | Status: DC
  Administered 2021-02-19 – 2021-02-21 (×3): 40 mg via ORAL
  Filled 2021-02-18 (×3): qty 1

## 2021-02-18 NOTE — Progress Notes (Signed)
**Note Adam-Identified via Obfuscation** PROGRESS NOTE    Adam Hartman  RUE:[id number] DOB: 04/05/1966 DOA: 02/16/2021 PCP: Juliette AlcideBurdine, Steven E, MD   Brief Narrative:  Adam Hartman is a 55 y.o. male with history h/o hypertension, GERD, heavy alcohol use, anxiety/depression presented to GI clinic week earlier  with complaints of abdominal pain-diffuse with alcohol use until recently (quit more than  a month back).  Ultrasonogram performed in the clinic showed liver cirrhosis with small amount of ascites, splenomegaly.  Given complaints of severe pain he was referred to the ED-CT abdomen/pelvis obtained showing hepatic cirrhosis/mild to moderate ascites and findings consistent with portal venous hypertension, esophageal varices and splenomegaly.  Also reported on CT was a 2 cm right hepatic lobe ill-defined lesion, neoplasm could not be excluded per radiology and abdominal MRI recommended for further characterization.  Discharged home and ultimately presented back to the ED with recurrent severe abdominal pain this morning associated with nausea/dry heaving.  Hospitalist called for admission for pain control and further evaluation, GI consulted for assistance.  Assessment & Plan:   Principal Problem:   Abdominal pain Active Problems:   Alcohol abuse   Ascites   GERD (gastroesophageal reflux disease)   Cirrhosis (HCC)   Portal hypertension (HCC)   Secondary esophageal varices without bleeding (HCC)   Acute metabolic encephalopathy   Acute toxic encephalopathy, resolved  Cannot rule out concurrent metabolic encephalopathy in the setting of hyperammonemia - Mental status resolved with narcan and decrease of narcotics - Continue decreased dose of narcotics as below - Ammonia downtrending - There was also some concern for alcohol withdrawal and possible postictal state however he has not reported drinking for some 2 months now and given resolution of symptoms with Narcan and again unlikely to be withdrawal related.  Acute  intractable abdominal pain, in the setting of ascites, unclear etiology  - CT abdomen findings as above with ill-defined hepatic lesion, ascites - GI following, appreciate insight and recommendations - Paracentesis labs unremarkable for infection, cytology pending - MRI is not amenable to biopsy.  Will likely need an EGD in the near future per GI -unclear if this will be done while inpatient or will need to be scheduled in the near future  Liver cirrhosis and unspecified liver lesion; both are new diagnoses, POA - Complicated by splenomegaly, portal hypertension, ascites and esophageal varices.   - GI following - Pending AFP, diagnostic paracentesis - Per MRI "not the typical features of hepatocellular carcinoma, although non-HCC malignancy such as intrahepatic cholangiocarcinoma or metastatic disease cannot be excluded. Recommend MR follow-up in 3 months. This lesion is likely too small to evaluate or biopsy by ultrasound"  Likely chronic sleep apnea, undiagnosed  - Lengthy discussion reveals he had been set up for sleep study on multiple occasions but never followed up, will offer nighttime oxygen/positive pressure as indicated.  Chronic alcohol abuse:  - Patient was a heavy drinker but reports quitting at least a month back (reports 2 month to myself today).  No signs of withdrawal currently.  Thiamine/multivitamin supplement ordered.  DVT prophylaxis: SCDs Code Status: Full Family Communication: None present  Status is: Inpatient   Dispo: The patient is from: Home              Anticipated d/c is to: Home              Anticipated d/c date is: 48 to 72 hours              Patient currently not medically stable for discharge  Consultants:   GI  Procedures:   Paracentesis 02/17/2021  Antimicrobials:  None  Subjective: No acute issues or events overnight denies nausea vomiting diarrhea constipation headache fevers chills chest pain shortness of breath.  Abdominal pain is  markedly improving.  Objective: Vitals:   02/18/21 0100 02/18/21 0200 02/18/21 0300 02/18/21 0400  BP: 103/66 109/71 106/62 103/65  Pulse: 73 76 71 71  Resp: 18 16 11 15   Temp:    98.6 F (37 C)  TempSrc:    Oral  SpO2: 98% 98% 98% 98%  Weight:      Height:        Intake/Output Summary (Last 24 hours) at 02/18/2021 0659 Last data filed at 02/18/2021 0030 Gross per 24 hour  Intake --  Output 525 ml  Net -525 ml   Filed Weights   02/17/21 0926  Weight: 123.7 kg    Examination:  General:  Pleasantly resting in bed, No acute distress. HEENT:  Normocephalic atraumatic.  Sclerae nonicteric, noninjected.  Extraocular movements intact bilaterally. Neck:  Without mass or deformity.  Trachea is midline. Lungs:  Clear to auscultate bilaterally without rhonchi, wheeze, or rales. Heart:  Regular rate and rhythm.  Without murmurs, rubs, or gallops. Abdomen:  Soft, minimally tender diffusely without PMI, nondistended.  Without guarding or rebound. Extremities: Without cyanosis, clubbing, edema, or obvious deformity. Vascular:  Dorsalis pedis and posterior tibial pulses palpable bilaterally. Skin:  Warm and dry, no erythema, no ulcerations.    Data Reviewed: I have personally reviewed following labs and imaging studies  CBC: Recent Labs  Lab 02/15/21 1527 02/16/21 1349 02/17/21 0527 02/17/21 1103 02/18/21 0254  WBC 10.9* 9.1 17.3* 19.2* 11.8*  NEUTROABS 8.9* 6.7  --   --   --   HGB 15.0 15.6 15.5 14.7 12.9*  HCT 43.6 44.7 47.1 44.4 38.2*  MCV 98.2 97.2 103.1* 101.6* 100.0  PLT 176 180 192 175 137*   Basic Metabolic Panel: Recent Labs  Lab 02/15/21 1527 02/16/21 1349 02/17/21 0527 02/17/21 1103 02/18/21 0254  NA 138 134* 135 132* 133*  K 3.8 4.0 4.4 4.6 4.2  CL 105 104 101 100 101  CO2 21* 21* 23 22 25   GLUCOSE 160* 149* 146* 185* 114*  BUN 5* 5* 7 9 10   CREATININE 0.58* 0.62 0.83 0.89 0.69  CALCIUM 9.0 8.8* 8.8* 8.7* 8.4*   GFR: Estimated Creatinine Clearance:  141.4 mL/min (by C-G formula based on SCr of 0.69 mg/dL). Liver Function Tests: Recent Labs  Lab 02/15/21 1527 02/16/21 1349 02/17/21 0527 02/17/21 1103 02/18/21 0254  AST 106* 94* 86* 86* 63*  ALT 44 42 40 41 33  ALKPHOS 110 84 80 75 68  BILITOT 3.0* 3.6* 3.5* 3.9* 3.0*  PROT 7.3 7.3 7.7 7.2 6.7  ALBUMIN 3.4* 3.3* 3.4* 3.3* 2.8*   Recent Labs  Lab 02/15/21 1527 02/16/21 1349  LIPASE 42 43   Recent Labs  Lab 02/15/21 1527 02/17/21 1103 02/18/21 0254  AMMONIA 31 95* 55*   Coagulation Profile: Recent Labs  Lab 02/16/21 1721  INR 1.6*   Cardiac Enzymes: No results for input(s): CKTOTAL, CKMB, CKMBINDEX, TROPONINI in the last 168 hours. BNP (last 3 results) No results for input(s): PROBNP in the last 8760 hours. HbA1C: No results for input(s): HGBA1C in the last 72 hours. CBG: Recent Labs  Lab 02/17/21 0855  GLUCAP 190*   Lipid Profile: No results for input(s): CHOL, HDL, LDLCALC, TRIG, CHOLHDL, LDLDIRECT in the last 72 hours. Thyroid Function Tests: No  results for input(s): TSH, T4TOTAL, FREET4, T3FREE, THYROIDAB in the last 72 hours. Anemia Panel: No results for input(s): VITAMINB12, FOLATE, FERRITIN, TIBC, IRON, RETICCTPCT in the last 72 hours. Sepsis Labs: Recent Labs  Lab 02/15/21 1554 02/15/21 1754  LATICACIDVEN 2.9* 1.9    Recent Results (from the past 240 hour(s))  SARS CORONAVIRUS 2 (TAT 6-24 HRS) Nasopharyngeal Nasopharyngeal Swab     Status: None   Collection Time: 02/15/21  3:28 PM   Specimen: Nasopharyngeal Swab  Result Value Ref Range Status   SARS Coronavirus 2 NEGATIVE NEGATIVE Final    Comment: (NOTE) SARS-CoV-2 target nucleic acids are NOT DETECTED.  The SARS-CoV-2 RNA is generally detectable in upper and lower respiratory specimens during the acute phase of infection. Negative results do not preclude SARS-CoV-2 infection, do not rule out co-infections with other pathogens, and should not be used as the sole basis for treatment  or other patient management decisions. Negative results must be combined with clinical observations, patient history, and epidemiological information. The expected result is Negative.  Fact Sheet for Patients: HairSlick.no  Fact Sheet for Healthcare Providers: quierodirigir.com  This test is not yet approved or cleared by the [place] FDA and  has been authorized for detection and/or diagnosis of SARS-CoV-2 by FDA under an Emergency Use Authorization (EUA). This EUA will remain  in effect (meaning this test can be used) for the duration of the COVID-19 declaration under Se ction 564(b)(1) of the Act, 21 U.S.C. section 360bbb-3(b)(1), unless the authorization is terminated or revoked sooner.  Performed at [facility], [address], [place], [place] [address]   Blood culture (routine x 2)     Status: None (Preliminary result)   Collection Time: 02/15/21  3:54 PM   Specimen: Site Not Specified; Blood  Result Value Ref Range Status   Specimen Description   Final    SITE NOT SPECIFIED Performed at [facility], [address], [place], [place] [address]    Special Requests   Final    BOTTLES DRAWN AEROBIC AND ANAEROBIC Blood Culture adequate volume Performed at [facility], [address] [place], [place] [address]    Culture   Final    NO GROWTH 2 DAYS Performed at [facility], [address], [place], [place] [address]    Report Status PENDING  Incomplete  Blood culture (routine x 2)     Status: None (Preliminary result)   Collection Time: 02/15/21  4:03 PM   Specimen: Site Not Specified; Blood  Result Value Ref Range Status   Specimen Description   Final    SITE NOT SPECIFIED Performed at [facility], [address] [place], [place] [address]    Special Requests   Final    BOTTLES DRAWN AEROBIC AND ANAEROBIC Blood Culture adequate  volume Performed at [facility], [address] [place], [place] [address]    Culture   Final    NO GROWTH 2 DAYS Performed at [facility], [address] [place], [place] [address]    Report Status PENDING  Incomplete  MRSA PCR Screening     Status: None   Collection Time: 02/17/21 12:16 PM   Specimen: Nasopharyngeal  Result Value Ref Range Status   MRSA by PCR NEGATIVE NEGATIVE Final    Comment:        The GeneXpert MRSA Assay (FDA approved for NASAL specimens only), is one component of a comprehensive MRSA  colonization surveillance program. It is not intended to diagnose MRSA infection nor to guide or monitor treatment for MRSA infections. Performed at [facility], [address] [facility] [address]   Gram stain     Status: None   Collection Time: 02/17/21  1:12 PM   Specimen: Peritoneal Washings  Result Value Ref Range Status   Specimen Description PERITONEAL  Final   Special Requests NONE  Final   Gram Stain   Final    WBC PRESENT,BOTH PMN AND MONONUCLEAR NO ORGANISMS SEEN CYTOSPIN SMEAR Performed at [facility], [address], [place], [place] [address]    Report Status 02/17/2021 FINAL  Final         Radiology Studies: MR LIVER W WO CONTRAST  Result Date: 02/16/2021 CLINICAL DATA:  Abdominal pain. History of cirrhosis and portal hypertension. Liver lesion on CT EXAM: MRI ABDOMEN WITHOUT AND WITH CONTRAST TECHNIQUE: Multiplanar multisequence MR imaging of the abdomen was performed both before and after the administration of intravenous contrast. CONTRAST:  10mL GADAVIST GADOBUTROL 1 MMOL/ML IV SOLN COMPARISON:  Abdominopelvic CT 02/15/2021. Abdominal ultrasound 01/19/2021. FINDINGS: Image quality is mildly degraded by motion artifact. Lower chest: The visualized lower chest demonstrates no significant findings. Hepatobiliary: Mild hepatomegaly with the liver measuring up to 24 cm in height. There are  mild morphologic changes of cirrhosis. There is a simple cyst anteriorly in the dome of the right hepatic lobe which measures 1.4 cm on image 6/11. There are additional tiny cysts. The lesion of concern inferiorly in the right hepatic lobe corresponds with an ill-defined area of mildly increased T2 signal measuring 1.8 cm on image 40/11. No arterial phase enhancement of this lesion is seen, although there is delayed ring enhancement with a small central area of nonenhancement measuring 7 mm on image 71/25. No other focal liver lesions are identified. No evidence of gallstones, gallbladder wall thickening or biliary dilatation. Pancreas: Unremarkable. No pancreatic ductal dilatation or surrounding inflammatory changes. Spleen: Mild to moderate splenomegaly without focal abnormality. Adrenals/Urinary Tract: Both adrenal glands appear normal. No suspicious renal findings. No evidence of renal mass or hydronephrosis. Stomach/Bowel: The stomach appears unremarkable for its degree of distension. No evidence of bowel wall thickening, distention or surrounding inflammatory change. Vascular/Lymphatic: Several prominent lymph nodes in the porta hepatis are similar to recent CT and likely reactive given the patient's cirrhosis. No retroperitoneal lymphadenopathy. No acute vascular findings identified. Gastric and distal esophageal varices again noted consistent with portal hypertension. The portal, superior mesenteric and splenic veins are patent. Other: Mild-to-moderate ascites. Musculoskeletal: No acute or significant osseous findings. IMPRESSION: 1. The previously demonstrated lesion of concern inferiorly in the right hepatic lobe has nonspecific MR features with delayed ring enhancement and ill-defined T2 signal. These are not the typical features of hepatocellular carcinoma, although non-HCC malignancy such as intrahepatic cholangiocarcinoma or metastatic disease cannot be excluded. Recommend MR follow-up in 3 months.  This lesion is likely too small to evaluate or biopsy by ultrasound. 2. Morphologic changes of cirrhosis with secondary signs of portal hypertension. Mild-to-moderate ascites. 3. Prominent lymph nodes in the porta hepatis, likely reactive. Electronically Signed   By: Carey Bullocks M.D.   On: 02/16/2021 19:58   US Paracentesis  Result Date: 02/17/2021 INDICATION: Ascites EXAM: ULTRASOUND GUIDED LLQ PARACENTESIS MEDICATIONS: 10 cc 1% lidocaine. COMPLICATIONS: None immediate. PROCEDURE: Informed written consent was obtained from the patient after a discussion of the risks, benefits and alternatives to treatment. A timeout was performed prior  to the initiation of the procedure. Initial ultrasound scanning demonstrates a large amount of ascites within the left lower abdominal quadrant. The left lower abdomen was prepped and draped in the usual sterile fashion. 1% lidocaine was used for local anesthesia. Following this, a Yueh catheter was introduced. An ultrasound image was saved for documentation purposes. The paracentesis was performed. The catheter was removed and a dressing was applied. The patient tolerated the procedure well without immediate post procedural complication. Patient received post-procedure intravenous albumin; see nursing notes for details. FINDINGS: A total of approximately 2.1 liters of dark yellow fluid was removed. Samples were sent to the laboratory as requested by the clinical team. IMPRESSION: Successful ultrasound-guided paracentesis yielding 2.1 liters of peritoneal fluid. Read by Robet Leu [facility] Electronically Signed   By: Malachy Moan M.D.   On: 02/17/2021 14:47    Scheduled Meds: . Chlorhexidine Gluconate Cloth  6 each Topical Daily  . pantoprazole (PROTONIX) IV  40 mg Intravenous Q12H  . spironolactone  25 mg Oral Daily  . thiamine  100 mg Oral Daily    LOS: 1 day   Time spent: 55 min  Azucena Fallen, DO [facility]  If 7PM-7AM, please contact  night-coverage [email or web address]  02/18/2021, 6:59 AM

## 2021-02-18 NOTE — Plan of Care (Signed)

## 2021-02-18 NOTE — Progress Notes (Addendum)
Attending physician's note   I have taken an interval history, reviewed the chart and examined the patient. I agree with the Advanced Practitioner's note, impression, and recommendations as outlined.   Paracentesis completed yesterday with 2.1 L removed.  Ascites fluid negative for SBP, elevated SAAG c/w portal hypertension.  Ammonia 55.  - Started on Aldactone 25 mg/day.  Plan to uptitrate to 50 mg/day and add Lasix 20 mg/day as BP allows - Trend BMP with diuretics being started - AFP pending.  If AFP elevated, reasonable to get Oncology consult given the atypical MRI finding.  If AFP normal, short interval repeat MRI three-phase in 3 months - Will eventually need EGD for EV screening as he continues to improve clinically - Again counseled on complete cessation of all EtOH  Adam Kiel, DO, FACG [phone] office             [facility] Progress Note  CC:  Abdominal pain and cirrhosis  Subjective:  Feels ok, tired.  Had 2.1 Liters removed on paracentesis yesterday.  Still has some abdominal pain.  Says that it is making lots of noise but not BM.  Objective:  Vital signs in last 24 hours: Temp:  [97.9 F (36.6 C)-98.6 F (37 C)] 97.9 F (36.6 C) (05/08 0800) Pulse Rate:  [71-123] 73 (05/08 0800) Resp:  [11-26] 21 (05/08 0800) BP: (103-123)/(62-82) 105/73 (05/08 0800) SpO2:  [86 %-98 %] 97 % (05/08 0800) Weight:  [123.7 kg] 123.7 kg (05/07 0926) Last BM Date: 02/15/21 General:  Alert, Well-developed, in NAD Heart:  Regular rate and rhythm; no murmurs Pulm:  CTAB.  No W/R/R. Abdomen:  Soft, non-distended.  BS present.  Mild diffuse TTP.   Extremities:  Mild edema in B/L LEs. Neurologic:  Alert and oriented x 4;  grossly normal neurologically.  Intake/Output from previous day: 05/07 0701 - 05/08 0700 In: -  Out: 525 [Urine:525]  Lab Results: Recent Labs    02/17/21 0527 02/17/21 1103 02/18/21 0254  WBC 17.3* 19.2* 11.8*  HGB 15.5 14.7  12.9*  HCT 47.1 44.4 38.2*  PLT 192 175 137*   BMET Recent Labs    02/17/21 0527 02/17/21 1103 02/18/21 0254  NA 135 132* 133*  K 4.4 4.6 4.2  CL 101 100 101  CO2 23 22 25   GLUCOSE 146* 185* 114*  BUN 7 9 10   CREATININE 0.83 0.89 0.69  CALCIUM 8.8* 8.7* 8.4*   LFT Recent Labs    02/18/21 0254  PROT 6.7  ALBUMIN 2.8*  AST 63*  ALT 33  ALKPHOS 68  BILITOT 3.0*   PT/INR Recent Labs    02/16/21 1721  LABPROT 19.0*  INR 1.6*   MR LIVER W WO CONTRAST  Result Date: 02/16/2021 CLINICAL DATA:  Abdominal pain. History of cirrhosis and portal hypertension. Liver lesion on CT EXAM: MRI ABDOMEN WITHOUT AND WITH CONTRAST TECHNIQUE: Multiplanar multisequence MR imaging of the abdomen was performed both before and after the administration of intravenous contrast. CONTRAST:  9mL GADAVIST GADOBUTROL 1 MMOL/ML IV SOLN COMPARISON:  Abdominopelvic CT 02/15/2021. Abdominal ultrasound 01/19/2021. FINDINGS: Image quality is mildly degraded by motion artifact. Lower chest: The visualized lower chest demonstrates no significant findings. Hepatobiliary: Mild hepatomegaly with the liver measuring up to 24 cm in height. There are mild morphologic changes of cirrhosis. There is a simple cyst anteriorly in the dome of the right hepatic lobe which measures 1.4 cm on image 6/11. There are additional tiny cysts. The lesion of concern inferiorly in  the right hepatic lobe corresponds with an ill-defined area of mildly increased T2 signal measuring 1.8 cm on image 40/11. No arterial phase enhancement of this lesion is seen, although there is delayed ring enhancement with a small central area of nonenhancement measuring 7 mm on image 71/25. No other focal liver lesions are identified. No evidence of gallstones, gallbladder wall thickening or biliary dilatation. Pancreas: Unremarkable. No pancreatic ductal dilatation or surrounding inflammatory changes. Spleen: Mild to moderate splenomegaly without focal  abnormality. Adrenals/Urinary Tract: Both adrenal glands appear normal. No suspicious renal findings. No evidence of renal mass or hydronephrosis. Stomach/Bowel: The stomach appears unremarkable for its degree of distension. No evidence of bowel wall thickening, distention or surrounding inflammatory change. Vascular/Lymphatic: Several prominent lymph nodes in the porta hepatis are similar to recent CT and likely reactive given the patient's cirrhosis. No retroperitoneal lymphadenopathy. No acute vascular findings identified. Gastric and distal esophageal varices again noted consistent with portal hypertension. The portal, superior mesenteric and splenic veins are patent. Other: Mild-to-moderate ascites. Musculoskeletal: No acute or significant osseous findings. IMPRESSION: 1. The previously demonstrated lesion of concern inferiorly in the right hepatic lobe has nonspecific MR features with delayed ring enhancement and ill-defined T2 signal. These are not the typical features of hepatocellular carcinoma, although non-HCC malignancy such as intrahepatic cholangiocarcinoma or metastatic disease cannot be excluded. Recommend MR follow-up in 3 months. This lesion is likely too small to evaluate or biopsy by ultrasound. 2. Morphologic changes of cirrhosis with secondary signs of portal hypertension. Mild-to-moderate ascites. 3. Prominent lymph nodes in the porta hepatis, likely reactive. Electronically Signed   By: Carey Bullocks M.D.   On: 02/16/2021 19:58   US Paracentesis  Result Date: 02/17/2021 INDICATION: Ascites EXAM: ULTRASOUND GUIDED LLQ PARACENTESIS MEDICATIONS: 10 cc 1% lidocaine. COMPLICATIONS: None immediate. PROCEDURE: Informed written consent was obtained from the patient after a discussion of the risks, benefits and alternatives to treatment. A timeout was performed prior to the initiation of the procedure. Initial ultrasound scanning demonstrates a large amount of ascites within the left lower  abdominal quadrant. The left lower abdomen was prepped and draped in the usual sterile fashion. 1% lidocaine was used for local anesthesia. Following this, a Yueh catheter was introduced. An ultrasound image was saved for documentation purposes. The paracentesis was performed. The catheter was removed and a dressing was applied. The patient tolerated the procedure well without immediate post procedural complication. Patient received post-procedure intravenous albumin; see nursing notes for details. FINDINGS: A total of approximately 2.1 liters of dark yellow fluid was removed. Samples were sent to the laboratory as requested by the clinical team. IMPRESSION: Successful ultrasound-guided paracentesis yielding 2.1 liters of peritoneal fluid. Read by Robet Leu [facility] Electronically Signed   By: Malachy Moan M.D.   On: 02/17/2021 14:47   Assessment / Plan: #63 55 year old white male alcoholic with new diagnosis of cirrhosis made on ultrasound 01/15/2021 with finding of a cirrhotic appearing liver, splenomegaly,and a small amount of ascites. Patient has been drinking heavily over the past 3 to 4 years up to 24 beers per day plus vodka. He stopped drinkingrecently (notes said 4/4 but he told me 2 months ago). He has had gradually worsening abdominal pain over the pastseveralweeks which is now constant and mores severe for the past 2 days, he tells me the pain is diffuse and is tender all over butprimarily in the epigastrium. CT scan did not show anything acute but he has mild to moderate ascites and a liver lesion  as well as splenomegaly and esophageal varices c/w portal hypertension. MRI findings not typical for HCC, but intrahepatic cholangiocarcinoma, etc cannot be excluded.  Not amenable to biopsy so repeat imaging with MRI recommended in 3 months.  AFP pending. Had paracentesis on 5/7 with 2.1 Liters removed, negative for SBP.  WBC count trending down today. MELD Na is 18. EGD at some  point. Would add lasix 20 mg daily and increase aldactone to 50 mg daily if ok with hospitalist as patient's BP has been a little soft.   LOS: 1 day   Princella Pellegrini. Zehr  02/18/2021, 9:24 AM

## 2021-02-19 LAB — HEPATITIS B CORE ANTIBODY, TOTAL: Hep B Core Total Ab: NONREACTIVE

## 2021-02-19 LAB — CBC
HCT: 41.2 % (ref 39.0–52.0)
Hemoglobin: 13.8 g/dL (ref 13.0–17.0)
MCH: 33.8 pg (ref 26.0–34.0)
MCHC: 33.5 g/dL (ref 30.0–36.0)
MCV: 101 fL — ABNORMAL HIGH (ref 80.0–100.0)
Platelets: 136 10*3/uL — ABNORMAL LOW (ref 150–400)
RBC: 4.08 MIL/uL — ABNORMAL LOW (ref 4.22–5.81)
RDW: 14.2 % (ref 11.5–15.5)
WBC: 11.3 10*3/uL — ABNORMAL HIGH (ref 4.0–10.5)
nRBC: 0 % (ref 0.0–0.2)

## 2021-02-19 LAB — COMPREHENSIVE METABOLIC PANEL
ALT: 30 U/L (ref 0–44)
AST: 58 U/L — ABNORMAL HIGH (ref 15–41)
Albumin: 2.7 g/dL — ABNORMAL LOW (ref 3.5–5.0)
Alkaline Phosphatase: 63 U/L (ref 38–126)
Anion gap: 8 (ref 5–15)
BUN: 9 mg/dL (ref 6–20)
CO2: 26 mmol/L (ref 22–32)
Calcium: 8.3 mg/dL — ABNORMAL LOW (ref 8.9–10.3)
Chloride: 98 mmol/L (ref 98–111)
Creatinine, Ser: 0.56 mg/dL — ABNORMAL LOW (ref 0.61–1.24)
GFR, Estimated: >60 mL/min (ref >60)
Glucose, Bld: 103 mg/dL — ABNORMAL HIGH (ref 70–99)
Potassium: 4 mmol/L (ref 3.5–5.1)
Sodium: 132 mmol/L — ABNORMAL LOW (ref 135–145)
Total Bilirubin: 3.3 mg/dL — ABNORMAL HIGH (ref 0.3–1.2)
Total Protein: 6.1 g/dL — ABNORMAL LOW (ref 6.5–8.1)

## 2021-02-19 LAB — AMMONIA: Ammonia: 78 umol/L — ABNORMAL HIGH (ref 9–35)

## 2021-02-19 LAB — CULTURE, BLOOD (ROUTINE X 2)

## 2021-02-19 LAB — PROTIME-INR
INR: 1.7 — ABNORMAL HIGH (ref 0.8–1.2)
Prothrombin Time: 19.6 seconds — ABNORMAL HIGH (ref 11.4–15.2)

## 2021-02-19 LAB — HEPATITIS A ANTIBODY, TOTAL: hep A Total Ab: NONREACTIVE

## 2021-02-19 LAB — HEPATITIS B SURFACE ANTIGEN: Hepatitis B Surface Ag: NONREACTIVE

## 2021-02-19 LAB — HEPATITIS C ANTIBODY: HCV Ab: NONREACTIVE

## 2021-02-19 MED ORDER — OXYCODONE HCL ER 15 MG PO T12A: 15.0000 mg | EXTENDED_RELEASE_TABLET | Freq: Two times a day (BID) | ORAL | Status: DC

## 2021-02-19 MED ORDER — OXYCODONE HCL ER 10 MG PO T12A
10.0000 mg | EXTENDED_RELEASE_TABLET | Freq: Two times a day (BID) | ORAL | Status: DC
  Administered 2021-02-19 – 2021-02-21 (×5): 10 mg via ORAL
  Filled 2021-02-19 (×5): qty 1

## 2021-02-19 MED ORDER — FUROSEMIDE 20 MG PO TABS
20.0000 mg | ORAL_TABLET | Freq: Two times a day (BID) | ORAL | Status: DC
  Administered 2021-02-19 – 2021-02-20 (×3): 20 mg via ORAL
  Filled 2021-02-19 (×3): qty 1

## 2021-02-19 MED ORDER — LACTULOSE 10 GM/15ML PO SOLN
20.0000 g | Freq: Two times a day (BID) | ORAL | Status: DC
  Administered 2021-02-19 – 2021-02-21 (×5): 20 g via ORAL
  Filled 2021-02-19 (×5): qty 30

## 2021-02-19 NOTE — TOC Initial Note (Signed)
Transition of Care [facility]) - Initial/Assessment Note   Patient Details  Name: Adam Hartman MRN: [id number] Date of Birth: Jun 26, 1966  Transition of Care [facility]) CM/SW Contact:    Ewing Schlein, LCSW Phone Number: 02/19/2021, 12:56 PM  Clinical Narrative: Patient is a 55 year old male who was admitted for abdominal pain, alcohol abuse, ascites, GERD, cirrhosis, portal hypertension, acute metabolic encephalopathy, and liver lesion. Patient's wife requested call regarding patient being uninsured. CSW spoke with wife. Per wife, patient started the Medicaid application prior to admission and was inquiring if CSW could complete the application. CSW explained patient will need to complete the application and submit it as TOC does not complete/submit Medicaid applications. TOC to follow.  Expected Discharge Plan: Home/Self Care Barriers to Discharge: Continued Medical Work up,Inadequate or no insurance  Patient Goals and CMS Choice Patient states their goals for this hospitalization and ongoing recovery are:: Complete Medicaid application  Expected Discharge Plan and Services Expected Discharge Plan: Home/Self Care In-house Referral: Clinical Social Work Living arrangements for the past 2 months: Single Family Home  Prior Living Arrangements/Services Living arrangements for the past 2 months: Single Family Home Patient language and need for interpreter reviewed:: Yes Do you feel safe going back to the place where you live?: Yes      Need for Family Participation in Patient Care: No (Comment) Care giver support system in place?: Yes (comment) Criminal Activity/Legal Involvement Pertinent to Current Situation/Hospitalization: No - Comment as needed  Activities of Daily Living Home Assistive Devices/Equipment: None ADL Screening (condition at time of admission) Patient's cognitive ability adequate to safely complete daily activities?: Yes Is the patient deaf or have difficulty hearing?:  No Does the patient have difficulty seeing, even when wearing glasses/contacts?: No Does the patient have difficulty concentrating, remembering, or making decisions?: No Patient able to express need for assistance with ADLs?: Yes Does the patient have difficulty dressing or bathing?: No Independently performs ADLs?: Yes (appropriate for developmental age) Does the patient have difficulty walking or climbing stairs?: No Weakness of Legs: None Weakness of Arms/Hands: None  Emotional Assessment Alcohol / Substance Use: Alcohol Use Psych Involvement: No (comment)  Admission diagnosis:  Generalized abdominal pain [R10.84] Abdominal pain [R10.9] Ascites due to alcoholic cirrhosis (HCC) [K70.31] Acute metabolic encephalopathy [G93.41] Patient Active Problem List   Diagnosis Date Noted  . Liver lesion   . Abnormal MRI, liver   . Acute metabolic encephalopathy 02/17/2021  . Abdominal pain 02/16/2021  . Portal hypertension (HCC) 02/16/2021  . Secondary esophageal varices without bleeding (HCC) 02/16/2021  . Ascites 02/08/2021  . GERD (gastroesophageal reflux disease) 02/08/2021  . Cirrhosis (HCC) 02/08/2021  . Alcohol abuse 06/06/2012    Class: Acute   PCP:  Juliette Alcide, MD Pharmacy:   [facility] - [place], [place] - [address] [place] [address] Phone: [phone] Fax: [phone]  Readmission Risk Interventions No flowsheet data found.

## 2021-02-19 NOTE — Progress Notes (Signed)
PROGRESS NOTE    Adam Hartman  SHF:[id number] DOB: 1966-03-06 DOA: 02/16/2021 PCP: Juliette Alcide, MD   Brief Narrative:  Adam Hartman is a 55 y.o. male with history h/o hypertension, GERD, heavy alcohol use, anxiety/depression presented to GI clinic week earlier  with complaints of abdominal pain-diffuse with alcohol use until recently (quit more than  a month back).  Ultrasonogram performed in the clinic showed liver cirrhosis with small amount of ascites, splenomegaly.  Given complaints of severe pain he was referred to the ED-CT abdomen/pelvis obtained showing hepatic cirrhosis/mild to moderate ascites and findings consistent with portal venous hypertension, esophageal varices and splenomegaly.  Also reported on CT was a 2 cm right hepatic lobe ill-defined lesion, neoplasm could not be excluded per radiology and abdominal MRI recommended for further characterization.  Discharged home and ultimately presented back to the ED with recurrent severe abdominal pain this morning associated with nausea/dry heaving.  Hospitalist called for admission for pain control and further evaluation, GI consulted for assistance.  Assessment & Plan:   Principal Problem:   Abdominal pain Active Problems:   Alcohol abuse   Ascites   GERD (gastroesophageal reflux disease)   Cirrhosis (HCC)   Portal hypertension (HCC)   Secondary esophageal varices without bleeding (HCC)   Acute metabolic encephalopathy   Liver lesion   Abnormal MRI, liver  Acute intractable abdominal pain, in the setting of ascites  -Transition off IV narcotics, start low-dose oxycodone and titrate accordingly - CT abdomen findings as above with ill-defined hepatic lesion, ascites - Continue Lasix, spironolactone, lactulose - GI following, appreciate insight and recommendations - Paracentesis labs unremarkable for infection, cytology pending; may require repeat paracentesis in the next few days if diuretics are not effective -  MRI is not amenable to biopsy.  Will likely need an EGD in the near future per GI -unclear if this will be done while inpatient or will need to be scheduled in the near future  Liver cirrhosis and unspecified liver lesion; both are new diagnoses, POA Concurrent hyperammonemia - Complicated by splenomegaly, portal hypertension, ascites and esophageal varices.   - GI following - Pending AFP, diagnostic paracentesis pending, ammonia minimally elevated initiate lactulose as above - Per MRI "not the typical features of hepatocellular carcinoma, although non-HCC malignancy such as intrahepatic cholangiocarcinoma or metastatic disease cannot be excluded. Recommend MR follow-up in 3 months. This lesion is likely too small to evaluate or biopsy by ultrasound" - Meld 17 (MeldNa 21)  Likely chronic sleep apnea, undiagnosed  - Lengthy discussion reveals he had been set up for sleep study on multiple occasions but never followed up, will offer nighttime oxygen/positive pressure as indicated.  Chronic alcohol abuse Reports  - Patient was a heavy drinker but reports quitting at least a month back (reports 2 month to myself today).  No signs of withdrawal currently.  Thiamine/multivitamin supplement ordered.  Acute toxic encephalopathy, resolved  Cannot rule out concurrent metabolic encephalopathy in the setting of hyperammonemia - Mental status resolved with narcan and decrease of narcotics - Continue decreased dose of narcotics as above - Ammonia downtrending - There was also some concern for alcohol withdrawal and possible postictal state however he has not reported drinking for some 2 months now and given resolution of symptoms with Narcan and again unlikely to be withdrawal related.  DVT prophylaxis: SCDs Code Status: Full Family Communication: None present  Status is: Inpatient   Dispo: The patient is from: Home  Anticipated d/c is to: Home              Anticipated d/c date is:  48 to 72 hours              Patient currently not medically stable for discharge  Consultants:   GI  Procedures:   Paracentesis 02/17/2021  Antimicrobials:  None  Subjective: No acute issues or events overnight denies nausea vomiting diarrhea constipation headache fevers chills chest pain shortness of breath.  Abdominal pain is markedly improving.  Objective: Vitals:   02/18/21 1200 02/18/21 1600 02/18/21 2009 02/19/21 0531  BP: (!) 136/91 132/69 114/67 120/81  Pulse: 83 73 78 83  Resp: (!) 24 20 15 16   Temp: 98.1 F (36.7 C) 98.1 F (36.7 C) 98.8 F (37.1 C) 98.4 F (36.9 C)  TempSrc: Oral Oral Oral Oral  SpO2: 91% 94% 93% 96%  Weight:      Height:        Intake/Output Summary (Last 24 hours) at 02/19/2021 0737 Last data filed at 02/18/2021 1500 Gross per 24 hour  Intake 360 ml  Output 350 ml  Net 10 ml   Filed Weights   02/17/21 0926  Weight: 123.7 kg    Examination:  General:  Pleasantly resting in bed, No acute distress. HEENT:  Normocephalic atraumatic.  Sclerae nonicteric, noninjected.  Extraocular movements intact bilaterally. Neck:  Without mass or deformity.  Trachea is midline. Lungs:  Clear to auscultate bilaterally without rhonchi, wheeze, or rales. Heart:  Regular rate and rhythm.  Without murmurs, rubs, or gallops. Abdomen:  Soft, minimally tender diffusely without PMI, nondistended, nontympanic.  Without guarding or rebound. Extremities: Without cyanosis, clubbing, edema, or obvious deformity. Vascular:  Dorsalis pedis and posterior tibial pulses palpable bilaterally. Skin:  Warm and dry, no erythema, no ulcerations.    Data Reviewed: I have personally reviewed following labs and imaging studies  CBC: Recent Labs  Lab 02/15/21 1527 02/16/21 1349 02/17/21 0527 02/17/21 1103 02/18/21 0254 02/19/21 0410  WBC 10.9* 9.1 17.3* 19.2* 11.8* 11.3*  NEUTROABS 8.9* 6.7  --   --   --   --   HGB 15.0 15.6 15.5 14.7 12.9* 13.8  HCT 43.6 44.7 47.1  44.4 38.2* 41.2  MCV 98.2 97.2 103.1* 101.6* 100.0 101.0*  PLT 176 180 192 175 137* 136*   Basic Metabolic Panel: Recent Labs  Lab 02/16/21 1349 02/17/21 0527 02/17/21 1103 02/18/21 0254 02/19/21 0410  NA 134* 135 132* 133* 132*  K 4.0 4.4 4.6 4.2 4.0  CL 104 101 100 101 98  CO2 21* 23 22 25 26   GLUCOSE 149* 146* 185* 114* 103*  BUN 5* 7 9 10 9   CREATININE 0.62 0.83 0.89 0.69 0.56*  CALCIUM 8.8* 8.8* 8.7* 8.4* 8.3*   GFR: Estimated Creatinine Clearance: 141.4 mL/min (A) (by C-G formula based on SCr of 0.56 mg/dL (L)). Liver Function Tests: Recent Labs  Lab 02/16/21 1349 02/17/21 0527 02/17/21 1103 02/18/21 0254 02/19/21 0410  AST 94* 86* 86* 63* 58*  ALT 42 40 41 33 30  ALKPHOS 84 80 75 68 63  BILITOT 3.6* 3.5* 3.9* 3.0* 3.3*  PROT 7.3 7.7 7.2 6.7 6.1*  ALBUMIN 3.3* 3.4* 3.3* 2.8* 2.7*   Recent Labs  Lab 02/15/21 1527 02/16/21 1349  LIPASE 42 43   Recent Labs  Lab 02/15/21 1527 02/17/21 1103 02/18/21 0254 02/19/21 0410  AMMONIA 31 95* 55* 78*   Coagulation Profile: Recent Labs  Lab 02/16/21 1721  INR 1.6*  Cardiac Enzymes: No results for input(s): CKTOTAL, CKMB, CKMBINDEX, TROPONINI in the last 168 hours. BNP (last 3 results) No results for input(s): PROBNP in the last 8760 hours. HbA1C: No results for input(s): HGBA1C in the last 72 hours. CBG: Recent Labs  Lab 02/17/21 0855  GLUCAP 190*   Lipid Profile: No results for input(s): CHOL, HDL, LDLCALC, TRIG, CHOLHDL, LDLDIRECT in the last 72 hours. Thyroid Function Tests: No results for input(s): TSH, T4TOTAL, FREET4, T3FREE, THYROIDAB in the last 72 hours. Anemia Panel: No results for input(s): VITAMINB12, FOLATE, FERRITIN, TIBC, IRON, RETICCTPCT in the last 72 hours. Sepsis Labs: Recent Labs  Lab 02/15/21 1554 02/15/21 1754  LATICACIDVEN 2.9* 1.9    Recent Results (from the past 240 hour(s))  SARS CORONAVIRUS 2 (TAT 6-24 HRS) Nasopharyngeal Nasopharyngeal Swab     Status: None    Collection Time: 02/15/21  3:28 PM   Specimen: Nasopharyngeal Swab  Result Value Ref Range Status   SARS Coronavirus 2 NEGATIVE NEGATIVE Final    Comment: (NOTE) SARS-CoV-2 target nucleic acids are NOT DETECTED.  The SARS-CoV-2 RNA is generally detectable in upper and lower respiratory specimens during the acute phase of infection. Negative results do not preclude SARS-CoV-2 infection, do not rule out co-infections with other pathogens, and should not be used as the sole basis for treatment or other patient management decisions. Negative results must be combined with clinical observations, patient history, and epidemiological information. The expected result is Negative.  Fact Sheet for Patients: HairSlick.no  Fact Sheet for Healthcare Providers: quierodirigir.com  This test is not yet approved or cleared by the [place] FDA and  has been authorized for detection and/or diagnosis of SARS-CoV-2 by FDA under an Emergency Use Authorization (EUA). This EUA will remain  in effect (meaning this test can be used) for the duration of the COVID-19 declaration under Se ction 564(b)(1) of the Act, 21 U.S.C. section 360bbb-3(b)(1), unless the authorization is terminated or revoked sooner.  Performed at [facility], [address] [place], [place] [address]   Blood culture (routine x 2)     Status: None (Preliminary result)   Collection Time: 02/15/21  3:54 PM   Specimen: Site Not Specified; Blood  Result Value Ref Range Status   Specimen Description   Final    SITE NOT SPECIFIED Performed at [facility], [address] [place], [place] [address]    Special Requests   Final    BOTTLES DRAWN AEROBIC AND ANAEROBIC Blood Culture adequate volume Performed at [facility], [address], [place], [place] [address]    Culture   Final    NO GROWTH 3 DAYS Performed at [facility], [address], [place], [place] [address]    Report Status PENDING  Incomplete  Blood culture (routine x 2)     Status: None (Preliminary result)   Collection Time: 02/15/21  4:03 PM   Specimen: Site Not Specified; Blood  Result Value Ref Range Status   Specimen Description   Final    SITE NOT SPECIFIED Performed at [facility], [address] [place], [place] [address]    Special Requests   Final    BOTTLES DRAWN AEROBIC AND ANAEROBIC Blood Culture adequate volume Performed at [facility], [address] [place], [place] [address]    Culture   Final    NO GROWTH 3 DAYS Performed at [facility], [address], [place],  [place] [address]    Report Status PENDING  Incomplete  MRSA PCR Screening     Status: None   Collection Time: 02/17/21 12:16 PM   Specimen: Nasopharyngeal  Result Value Ref Range Status   MRSA by PCR NEGATIVE NEGATIVE Final    Comment:        The GeneXpert MRSA Assay (FDA approved for NASAL specimens only), is one component of a comprehensive MRSA colonization surveillance program. It is not intended to diagnose MRSA infection nor to guide or monitor treatment for MRSA infections. Performed at [facility], [address] [place], [place] [address]   Culture, body fluid w Gram Stain-bottle     Status: None (Preliminary result)   Collection Time: 02/17/21  1:12 PM   Specimen: Peritoneal Washings  Result Value Ref Range Status   Specimen Description PERITONEAL  Final   Special Requests NONE  Final   Culture   Final    NO GROWTH < 24 HOURS Performed at [facility], [address], [place], [place] [address]    Report Status PENDING  Incomplete  Gram stain     Status: None   Collection Time: 02/17/21  1:12 PM   Specimen: Peritoneal Washings  Result Value Ref Range Status   Specimen Description PERITONEAL  Final   Special Requests NONE  Final   Gram Stain   Final     WBC PRESENT,BOTH PMN AND MONONUCLEAR NO ORGANISMS SEEN CYTOSPIN SMEAR Performed at [facility], [address], [place], [place] [address]    Report Status 02/17/2021 FINAL  Final         Radiology Studies: US Paracentesis  Result Date: 02/17/2021 INDICATION: Ascites EXAM: ULTRASOUND GUIDED LLQ PARACENTESIS MEDICATIONS: 10 cc 1% lidocaine. COMPLICATIONS: None immediate. PROCEDURE: Informed written consent was obtained from the patient after a discussion of the risks, benefits and alternatives to treatment. A timeout was performed prior to the initiation of the procedure. Initial ultrasound scanning demonstrates a large amount of ascites within the left lower abdominal quadrant. The left lower abdomen was prepped and draped in the usual sterile fashion. 1% lidocaine was used for local anesthesia. Following this, a Yueh catheter was introduced. An ultrasound image was saved for documentation purposes. The paracentesis was performed. The catheter was removed and a dressing was applied. The patient tolerated the procedure well without immediate post procedural complication. Patient received post-procedure intravenous albumin; see nursing notes for details. FINDINGS: A total of approximately 2.1 liters of dark yellow fluid was removed. Samples were sent to the laboratory as requested by the clinical team. IMPRESSION: Successful ultrasound-guided paracentesis yielding 2.1 liters of peritoneal fluid. Read by Robet Leu [facility] Electronically Signed   By: Malachy Moan M.D.   On: 02/17/2021 14:47    Scheduled Meds: . Chlorhexidine Gluconate Cloth  6 each Topical Daily  . furosemide  20 mg Oral BID  . oxyCODONE  15 mg Oral Q12H  . pantoprazole  40 mg Oral Daily  . spironolactone  25 mg Oral Daily  . thiamine  100 mg Oral Daily    LOS: 2 days   Time spent: 55 min  Azucena Fallen, DO [facility]  If 7PM-7AM, please contact night-coverage [email or web address]  02/19/2021, 7:37  AM

## 2021-02-19 NOTE — Progress Notes (Addendum)
[facility] Progress Note  CC:  Abdominal pain, EtOH cirrhosis  Subjective: He denies having any N/V. He continues to have central abdominal pain. He stated his abdominal pain improved slightly after his paracentesis. No BM x 3 days. He is passing gas per the rectum. No CP or SOB. Mother at the bedside.    Objective:   Abdominal MRI W/WO contrast 02/16/2021: 1. The previously demonstrated lesion of concern inferiorly in the right hepatic lobe has nonspecific MR features with delayed ring enhancement and ill-defined T2 signal. These are not the typical features of hepatocellular carcinoma, although non-HCC malignancy such as intrahepatic cholangiocarcinoma or metastatic disease cannot be excluded. Recommend MR follow-up in 3 months. This lesion is likely too small to evaluate or biopsy by ultrasound. 2. Morphologic changes of cirrhosis with secondary signs of portal hypertension. Mild-to-moderate ascites. 3. Prominent lymph nodes in the porta hepatis, likely reactive.  CTAP with contrast 02/15/2021: Hepatic cirrhosis, and findings of portal venous hypertension including esophageal varices and splenomegaly.  Mild-to-moderate ascites.  2 cm ill-defined low-attenuation lesion in posterior right hepatic lobe. Neoplasm cannot be excluded. Abdomen MRI without and with contrast recommended for further characterization.  Aortic Atherosclerosis   Vital signs in last 24 hours: Temp:  [98.1 F (36.7 C)-98.8 F (37.1 C)] 98.4 F (36.9 C) (05/09 0531) Pulse Rate:  [73-83] 83 (05/09 0531) Resp:  [15-24] 16 (05/09 0531) BP: (97-136)/(60-91) 120/81 (05/09 0531) SpO2:  [89 %-96 %] 96 % (05/09 0531) Last BM Date:  ("couple days" per pt) General:  Fatigued appearing male in NAD. Eyes: Mild scleral icterus.  Heart: RRR, no murmur.  Pulm: Breath sounds clear throughout.  Abdomen: Soft, ascites present, abdomen is not tense. Moderate tenderness to the central abdomen  between the xyphoid process and umbilicus, mild RLQ pain without rebound or guarding.  Extremities:  Without edema. Neurologic:  Alert and  oriented x 4. Hands slightly tremulous, concerning for possible early asterixis.  Skin: Mild jaundice.  Psych:  Alert and cooperative. Normal mood and affect.  Intake/Output from previous day: 05/08 0701 - 05/09 0700 In: 360 [P.O.:360] Out: 350 [Urine:350] Intake/Output this shift: Total I/O In: 240 [P.O.:240] Out: -   Lab Results: Recent Labs    02/17/21 1103 02/18/21 0254 02/19/21 0410  WBC 19.2* 11.8* 11.3*  HGB 14.7 12.9* 13.8  HCT 44.4 38.2* 41.2  PLT 175 137* 136*   BMET Recent Labs    02/17/21 1103 02/18/21 0254 02/19/21 0410  NA 132* 133* 132*  K 4.6 4.2 4.0  CL 100 101 98  CO2 '22 25 26  ' GLUCOSE 185* 114* 103*  BUN '9 10 9  ' CREATININE 0.89 0.69 0.56*  CALCIUM 8.7* 8.4* 8.3*   LFT Recent Labs    02/19/21 0410  PROT 6.1*  ALBUMIN 2.7*  AST 58*  ALT 30  ALKPHOS 63  BILITOT 3.3*   PT/INR Recent Labs    02/16/21 1721  LABPROT 19.0*  INR 1.6*   Hepatitis Panel No results for input(s): HEPBSAG, HCVAB, HEPAIGM, HEPBIGM in the last 72 hours.  US Paracentesis  Result Date: 02/17/2021 INDICATION: Ascites EXAM: ULTRASOUND GUIDED LLQ PARACENTESIS MEDICATIONS: 10 cc 1% lidocaine. COMPLICATIONS: None immediate. PROCEDURE: Informed written consent was obtained from the patient after a discussion of the risks, benefits and alternatives to treatment. A timeout was performed prior to the initiation of the procedure. Initial ultrasound scanning demonstrates a large amount of ascites within the left lower abdominal quadrant. The left lower abdomen was prepped  and draped in the usual sterile fashion. 1% lidocaine was used for local anesthesia. Following this, a Yueh catheter was introduced. An ultrasound image was saved for documentation purposes. The paracentesis was performed. The catheter was removed and a dressing was applied.  The patient tolerated the procedure well without immediate post procedural complication. Patient received post-procedure intravenous albumin; see nursing notes for details. FINDINGS: A total of approximately 2.1 liters of dark yellow fluid was removed. Samples were sent to the laboratory as requested by the clinical team. IMPRESSION: Successful ultrasound-guided paracentesis yielding 2.1 liters of peritoneal fluid. Read by Lavonia Drafts [facility] Electronically Signed   By: Jacqulynn Cadet M.D.   On: 02/17/2021 14:47    Assessment / Plan:  8. 55 year old male with EtOH cirrhosis. MELD Na 18. CT scan did not show anything acute but he has mild to moderate ascites and a liver lesion as well as splenomegaly and esophageal varices c/w portal hypertension. MRI findings not typical for [place], but intrahepatic cholangiocarcinoma cannot be excluded, noot amenable to biopsy. Repeat imaging with MRI recommended in 3 months. AFP pending. S/P paracentesis 5/7 with 2.1L peritoneal fluid removed. SAAG > 1.1 consistent with portal HTN/cirrhosis. Today Alk phos 63. T. Bili 3.3. AST 58. ALT 30. AFP pending.  -Hep B surface antigen, Hep B core total antibody, Hep C antibody, Hep A total antibody, Ceruloplasmin, iron, ferritin, ANA, IgG, AMA, SMA -BMP, hepatic panel, INR and CBC in am -Continue Pantoprazole 56m QD -Eventual EGD to survey for esophageal/gastric varices  -He will need Hep A and Hep B vaccinations if not immune  -Continue Lasix 264mQD and Aldactone 5064mD -Patient counseled no alcohol ever  -Continue Thiamine po -Oncology consult if AFP elevated  -Most likely will need repeat paracentesis in a few days  2. Central abdominal pain. WBC 11.3. He is afebrile. No SBP per paracentesis -Cautious use of narcotics. Currently on Oxycontin 68m48m bid   3. Acute encephalopathy secondary to naroctics +/ - hepatic encephalopathy with elevated ammonia level.  Ammonia level today 78. No overt encephalopathy at this  time.  - Lactulose 20gm po bid   4. Hyponatremia. Today Na 132 -No change in Furosemide or Spironolactone at this time -Repeat BMP in am   5. Coagulopathy secondary to cirrhosis. INR 1.6 on 02/16/2021.  -INR/PT    Principal Problem:   Abdominal pain Active Problems:   Alcohol abuse   Ascites   GERD (gastroesophageal reflux disease)   Cirrhosis (HCC)   Portal hypertension (HCC)   Secondary esophageal varices without bleeding (HCC)   Acute metabolic encephalopathy   Liver lesion   Abnormal MRI, liver     LOS: 2 days   CollNoralyn Pick9/2022, 9:48 AM    Attending physician's note   I have taken an interval history, reviewed the chart and examined the patient. I agree with the Advanced Practitioner's note, impression and recommendations.   Alcoholic cirrhosis decompensated with ascites, hepatic encephalopathy and ill-defined hepatic lesion Meld score 17  He is feeling better.  He had a bowel movement after lactulose. No asterixis on exam Abdomen remains distended with no tenderness.   MRI abdomen with findings of 2 cm ill-defined lesion, await AFP. Plan for repeat MRI in 3 months We will also check CEA and CA 19-9 to exclude cholangiocarcinoma or metastatic GI malignancy though seems less likely  Is due for  EGD for esophageal varices, will plan to schedule it prior to discharge or as outpatient  Continue lactulose, titrate to have 2-3 soft bowel movements daily  I have spent 35 minutes of patient care (this includes precharting, chart review, review of results, face-to-face time used for counseling as well as treatment plan and follow-up. The patient was provided an opportunity to ask questions and all were answered. The patient agreed with the plan and demonstrated an understanding of the instructions.  Damaris Hippo , MD [phone]

## 2021-02-20 LAB — CULTURE, BLOOD (ROUTINE X 2)
Culture: NO GROWTH
Culture: NO GROWTH

## 2021-02-20 LAB — CBC
HCT: 38.3 % — ABNORMAL LOW (ref 39.0–52.0)
Hemoglobin: 13.3 g/dL (ref 13.0–17.0)
MCH: 34.3 pg — ABNORMAL HIGH (ref 26.0–34.0)
MCHC: 34.7 g/dL (ref 30.0–36.0)
MCV: 98.7 fL (ref 80.0–100.0)
Platelets: 128 10*3/uL — ABNORMAL LOW (ref 150–400)
RBC: 3.88 MIL/uL — ABNORMAL LOW (ref 4.22–5.81)
RDW: 14.1 % (ref 11.5–15.5)
WBC: 11 10*3/uL — ABNORMAL HIGH (ref 4.0–10.5)
nRBC: 0 % (ref 0.0–0.2)

## 2021-02-20 LAB — COMPREHENSIVE METABOLIC PANEL
ALT: 32 U/L (ref 0–44)
AST: 55 U/L — ABNORMAL HIGH (ref 15–41)
Albumin: 2.8 g/dL — ABNORMAL LOW (ref 3.5–5.0)
Alkaline Phosphatase: 66 U/L (ref 38–126)
Anion gap: 8 (ref 5–15)
BUN: 6 mg/dL (ref 6–20)
CO2: 27 mmol/L (ref 22–32)
Calcium: 8.5 mg/dL — ABNORMAL LOW (ref 8.9–10.3)
Chloride: 100 mmol/L (ref 98–111)
Creatinine, Ser: 0.52 mg/dL — ABNORMAL LOW (ref 0.61–1.24)
GFR, Estimated: >60 mL/min (ref >60)
Glucose, Bld: 123 mg/dL — ABNORMAL HIGH (ref 70–99)
Potassium: 3.5 mmol/L (ref 3.5–5.1)
Sodium: 135 mmol/L (ref 135–145)
Total Bilirubin: 2.7 mg/dL — ABNORMAL HIGH (ref 0.3–1.2)
Total Protein: 6.5 g/dL (ref 6.5–8.1)

## 2021-02-20 LAB — PROTIME-INR
INR: 1.7 — ABNORMAL HIGH (ref 0.8–1.2)
Prothrombin Time: 19.8 seconds — ABNORMAL HIGH (ref 11.4–15.2)

## 2021-02-20 LAB — MITOCHONDRIAL ANTIBODIES: Mitochondrial M2 Ab, IgG: <20 Units (ref 0.0–20.0)

## 2021-02-20 LAB — ANTI-SMOOTH MUSCLE ANTIBODY, IGG: F-Actin IgG: 11 Units (ref 0–19)

## 2021-02-20 LAB — CERULOPLASMIN: Ceruloplasmin: 20.6 mg/dL (ref 16.0–31.0)

## 2021-02-20 LAB — HEPATITIS B SURFACE ANTIBODY, QUANTITATIVE: Hep B S AB Quant (Post): <3.1 m[IU]/mL — ABNORMAL LOW (ref >9.9)

## 2021-02-20 LAB — ALPHA-1-ANTITRYPSIN: A-1 Antitrypsin, Ser: 210 mg/dL — ABNORMAL HIGH (ref 101–187)

## 2021-02-20 LAB — AMMONIA: Ammonia: 62 umol/L — ABNORMAL HIGH (ref 9–35)

## 2021-02-20 LAB — IGG: IgG (Immunoglobin G), Serum: 1193 mg/dL (ref 603–1613)

## 2021-02-20 LAB — CYTOLOGY - NON PAP

## 2021-02-20 LAB — ANA: Anti Nuclear Antibody (ANA): POSITIVE — AB

## 2021-02-20 MED ORDER — FUROSEMIDE 20 MG PO TABS
20.0000 mg | ORAL_TABLET | Freq: Once | ORAL | Status: AC
  Administered 2021-02-20: 20 mg via ORAL
  Filled 2021-02-20: qty 1

## 2021-02-20 MED ORDER — SPIRONOLACTONE 100 MG PO TABS
100.0000 mg | ORAL_TABLET | Freq: Every day | ORAL | Status: DC
  Administered 2021-02-21: 100 mg via ORAL
  Filled 2021-02-20: qty 1

## 2021-02-20 MED ORDER — FUROSEMIDE 40 MG PO TABS
40.0000 mg | ORAL_TABLET | Freq: Every day | ORAL | Status: DC
  Administered 2021-02-21: 40 mg via ORAL
  Filled 2021-02-20: qty 1

## 2021-02-20 MED ORDER — SPIRONOLACTONE 25 MG PO TABS
50.0000 mg | ORAL_TABLET | Freq: Once | ORAL | Status: AC
  Administered 2021-02-20: 50 mg via ORAL
  Filled 2021-02-20: qty 2

## 2021-02-20 NOTE — H&P (View-Only) (Signed)
[facility] Progress Note  CC:  Abdominal pain, EtOH cirrhosis  Subjective:  No confusion. He complains feeling fatigued. He passed 3 soft formed brown stools yesterday evening and during the night. No loose stools. No rectal bleeding or black stools. He complains of central abdominal pain, not severe, feels gassy. His abdominal pain is a little less since starting Lactulose yesterday and after passing a few BMs. No N/V. No CP or SOB. No family at the bedside at this time.   Objective:   Abdominal MRI W/WO contrast 02/16/2021: 1. The previously demonstrated lesion of concern inferiorly in the right hepatic lobe has nonspecific MR features with delayed ring enhancement and ill-defined T2 signal. These are not the typical features of hepatocellular carcinoma, although non-HCC malignancy such as intrahepatic cholangiocarcinoma or metastatic disease cannot be excluded. Recommend MR follow-up in 3 months. This lesion is likely too small to evaluate or biopsy by ultrasound. 2. Morphologic changes of cirrhosis with secondary signs of portal hypertension. Mild-to-moderate ascites. 3. Prominent lymph nodes in the porta hepatis, likely reactive.  CTAP with contrast 02/15/2021: Hepatic cirrhosis, and findings of portal venous hypertension including esophageal varices and splenomegaly. Mild-to-moderate ascites. 2 cm ill-defined low-attenuation lesion in posterior right hepatic lobe. Neoplasm cannot be excluded. Abdomen MRI without and with contrast recommended for further characterization. Aortic Atherosclerosis   Vital signs in last 24 hours: Temp:  [98.2 F (36.8 C)-98.6 F (37 C)] 98.2 F (36.8 C) (05/10 6861) Pulse Rate:  [77-84] 78 (05/10 0632) Resp:  [17-18] 18 (05/10 6837) BP: (129-133)/(68-82) 131/82 (05/10 0632) SpO2:  [95 %-97 %] 97 % (05/10 2902) Last BM Date: 02/19/21 General:   Alert in NAD.  Eyes: Very mild scleral icterus.  Heart: RRR, no murmur.   Pulm:  Breath sounds clear throughout.  Abdomen: Distended, soft. Mild ascites, abdomen is not tense. Moderate upper and central abdominal tenderness without rebound our guarding. + BS x 4 quads.  Extremities:  Without edema. Neurologic:  Alert and  oriented x 4. Hands not tremulous today. No asterixis.  Psych:  Alert and cooperative. Normal mood and affect.  Intake/Output from previous day: 05/09 0701 - 05/10 0700 In: 1680 [P.O.:1680] Out: -  Intake/Output this shift: Total I/O In: 60 [P.O.:60] Out: -   Lab Results: Recent Labs    02/18/21 0254 02/19/21 0410 02/20/21 0437  WBC 11.8* 11.3* 11.0*  HGB 12.9* 13.8 13.3  HCT 38.2* 41.2 38.3*  PLT 137* 136* 128*   BMET Recent Labs    02/18/21 0254 02/19/21 0410 02/20/21 0437  NA 133* 132* 135  K 4.2 4.0 3.5  CL 101 98 100  CO2 _0 GLUCOSE 114* 103* 123*  BUN _1 CREATININE 0.69 0.56* 0.52*  CALCIUM 8.4* 8.3* 8.5*   LFT Recent Labs    02/20/21 0437  PROT 6.5  ALBUMIN 2.8*  AST 55*  ALT 32  ALKPHOS 66  BILITOT 2.7*   PT/INR Recent Labs    02/19/21 1108 02/20/21 0437  LABPROT 19.6* 19.8*  INR 1.7* 1.7*   Hepatitis Panel Recent Labs    02/19/21 1108  HEPBSAG NON REACTIVE  HCVAB NON REACTIVE    No results found.  Assessment / Plan:  15. 55 year old male with EtOH cirrhosis. MELD Na 18. CT scan did not show anything acute but identified mild to moderate ascites and a liver lesion as well as splenomegaly and esophageal varices c/w portal hypertension. MRI findings not typical for [place],  but intrahepatic cholangiocarcinoma cannot be excluded, not amenable to biopsy. Repeat imaging with MRI recommended in 3 months. AFP. S/P paracentesis 5/7 with 2.1L peritoneal fluid removed. SAAG > 1.1 consistent with portal HTN/cirrhosis. No SBP. Today Alk phos 66. T. Bili 2.7. AST 55. ALT 32.  AFP pending. CEA and CA 19-9 pending. Hep B and Hep B serologies negative. Ceruloplasmin level 20.6. IgG 1,193.   -Continue Pantoprazole 9m QD -Eventual EGD to survey for esophageal/gastric varices -Continue Lasix 2106mQD and Aldactone 5073mD. Sodium level improved and creatinine stable. Consider increasing diuretic doses, await recommendations per Dr. NanSilverio Decamp-Patient counseled no alcohol ever  -Continue Thiamine po -Oncology consult if AFP elevated  -Most likely will need repeat paracentesis later this week, abdomen is not tense today  2. Central abdominal pain. WBC 11.0. He is afebrile. No SBP per paracentesis -Cautious use of narcotics. Currently on Oxycontin 12m46m bid   3. Acute encephalopathy secondary to naroctics +/ - hepatic encephalopathy with elevated ammonia level.  Ammonia level 78 -> 62. No overt encephalopathy at this time. Lactulose started on 5/9.  - Continue Lactulose 20gm po bid   4. Coagulopathy secondary to cirrhosis. INR 1.7.        Principal Problem:   Abdominal pain Active Problems:   Alcohol abuse   Ascites   GERD (gastroesophageal reflux disease)   Cirrhosis (HCC)   Portal hypertension (HCC)   Secondary esophageal varices without bleeding (HCC)   Acute metabolic encephalopathy   Liver lesion   Abnormal MRI, liver     LOS: 3 days   CollPatrecia Pournedy-Smith  02/20/2021, 8:26 AM   Attending physician's note   I have taken an interval history, reviewed the chart and examined the patient. I agree with the Advanced Practitioner's note, impression and recommendations.   55 y5M with ETOH cirrhosis decompendated by ascites MELD Na 18  Ascites:BMP remains stable Will plan to increase diuretic to Lasix 40mg7mly and Aldactone 100mg 33my  Hepatic encephalopathy: Continue lactulose with goal 2-3BM/day  Hepatic lesion: F/u MRI in 3 months for surveillance as outpatient. Await AFP, CEA and CA 19-9  Will plan for EGD tomorrow for esophageal varices screening if patient is still hospitalized or will plan for outpatient procedure if he is  discharged  Will need f/u CMP in 1 week and office follow up visit in 2-3 weeks on discharge   The patient was provided an opportunity to ask questions and all were answered. The patient agreed with the plan and demonstrated an understanding of the instructions.  K. VeeDamaris [phone]

## 2021-02-20 NOTE — Progress Notes (Addendum)
[facility] Progress Note  CC:  Abdominal pain, EtOH cirrhosis  Subjective:  No confusion. He complains feeling fatigued. He passed 3 soft formed brown stools yesterday evening and during the night. No loose stools. No rectal bleeding or black stools. He complains of central abdominal pain, not severe, feels gassy. His abdominal pain is a little less since starting Lactulose yesterday and after passing a few BMs. No N/V. No CP or SOB. No family at the bedside at this time.   Objective:   Abdominal MRI W/WO contrast 02/16/2021: 1. The previously demonstrated lesion of concern inferiorly in the right hepatic lobe has nonspecific MR features with delayed ring enhancement and ill-defined T2 signal. These are not the typical features of hepatocellular carcinoma, although non-HCC malignancy such as intrahepatic cholangiocarcinoma or metastatic disease cannot be excluded. Recommend MR follow-up in 3 months. This lesion is likely too small to evaluate or biopsy by ultrasound. 2. Morphologic changes of cirrhosis with secondary signs of portal hypertension. Mild-to-moderate ascites. 3. Prominent lymph nodes in the porta hepatis, likely reactive.  CTAP with contrast 02/15/2021: Hepatic cirrhosis, and findings of portal venous hypertension including esophageal varices and splenomegaly. Mild-to-moderate ascites. 2 cm ill-defined low-attenuation lesion in posterior right hepatic lobe. Neoplasm cannot be excluded. Abdomen MRI without and with contrast recommended for further characterization. Aortic Atherosclerosis   Vital signs in last 24 hours: Temp:  [98.2 F (36.8 C)-98.6 F (37 C)] 98.2 F (36.8 C) (05/10 6861) Pulse Rate:  [77-84] 78 (05/10 0632) Resp:  [17-18] 18 (05/10 6837) BP: (129-133)/(68-82) 131/82 (05/10 0632) SpO2:  [95 %-97 %] 97 % (05/10 2902) Last BM Date: 02/19/21 General:   Alert in NAD.  Eyes: Very mild scleral icterus.  Heart: RRR, no murmur.   Pulm:  Breath sounds clear throughout.  Abdomen: Distended, soft. Mild ascites, abdomen is not tense. Moderate upper and central abdominal tenderness without rebound our guarding. + BS x 4 quads.  Extremities:  Without edema. Neurologic:  Alert and  oriented x 4. Hands not tremulous today. No asterixis.  Psych:  Alert and cooperative. Normal mood and affect.  Intake/Output from previous day: 05/09 0701 - 05/10 0700 In: 1680 [P.O.:1680] Out: -  Intake/Output this shift: Total I/O In: 60 [P.O.:60] Out: -   Lab Results: Recent Labs    02/18/21 0254 02/19/21 0410 02/20/21 0437  WBC 11.8* 11.3* 11.0*  HGB 12.9* 13.8 13.3  HCT 38.2* 41.2 38.3*  PLT 137* 136* 128*   BMET Recent Labs    02/18/21 0254 02/19/21 0410 02/20/21 0437  NA 133* 132* 135  K 4.2 4.0 3.5  CL 101 98 100  CO2 _0 GLUCOSE 114* 103* 123*  BUN _1 CREATININE 0.69 0.56* 0.52*  CALCIUM 8.4* 8.3* 8.5*   LFT Recent Labs    02/20/21 0437  PROT 6.5  ALBUMIN 2.8*  AST 55*  ALT 32  ALKPHOS 66  BILITOT 2.7*   PT/INR Recent Labs    02/19/21 1108 02/20/21 0437  LABPROT 19.6* 19.8*  INR 1.7* 1.7*   Hepatitis Panel Recent Labs    02/19/21 1108  HEPBSAG NON REACTIVE  HCVAB NON REACTIVE    No results found.  Assessment / Plan:  15. 55 year old male with EtOH cirrhosis. MELD Na 18. CT scan did not show anything acute but identified mild to moderate ascites and a liver lesion as well as splenomegaly and esophageal varices c/w portal hypertension. MRI findings not typical for [place],  but intrahepatic cholangiocarcinoma cannot be excluded, not amenable to biopsy. Repeat imaging with MRI recommended in 3 months. AFP. S/P paracentesis 5/7 with 2.1L peritoneal fluid removed. SAAG > 1.1 consistent with portal HTN/cirrhosis. No SBP. Today Alk phos 66. T. Bili 2.7. AST 55. ALT 32.  AFP pending. CEA and CA 19-9 pending. Hep B and Hep B serologies negative. Ceruloplasmin level 20.6. IgG 1,193.   -Continue Pantoprazole 9m QD -Eventual EGD to survey for esophageal/gastric varices -Continue Lasix 2106mQD and Aldactone 5073mD. Sodium level improved and creatinine stable. Consider increasing diuretic doses, await recommendations per Dr. NanSilverio Decamp-Patient counseled no alcohol ever  -Continue Thiamine po -Oncology consult if AFP elevated  -Most likely will need repeat paracentesis later this week, abdomen is not tense today  2. Central abdominal pain. WBC 11.0. He is afebrile. No SBP per paracentesis -Cautious use of narcotics. Currently on Oxycontin 12m46m bid   3. Acute encephalopathy secondary to naroctics +/ - hepatic encephalopathy with elevated ammonia level.  Ammonia level 78 -> 62. No overt encephalopathy at this time. Lactulose started on 5/9.  - Continue Lactulose 20gm po bid   4. Coagulopathy secondary to cirrhosis. INR 1.7.        Principal Problem:   Abdominal pain Active Problems:   Alcohol abuse   Ascites   GERD (gastroesophageal reflux disease)   Cirrhosis (HCC)   Portal hypertension (HCC)   Secondary esophageal varices without bleeding (HCC)   Acute metabolic encephalopathy   Liver lesion   Abnormal MRI, liver     LOS: 3 days   CollPatrecia Pournedy-Smith  02/20/2021, 8:26 AM   Attending physician's note   I have taken an interval history, reviewed the chart and examined the patient. I agree with the Advanced Practitioner's note, impression and recommendations.   54 y5M with ETOH cirrhosis decompendated by ascites MELD Na 18  Ascites:BMP remains stable Will plan to increase diuretic to Lasix 40mg7mly and Aldactone 100mg 33my  Hepatic encephalopathy: Continue lactulose with goal 2-3BM/day  Hepatic lesion: F/u MRI in 3 months for surveillance as outpatient. Await AFP, CEA and CA 19-9  Will plan for EGD tomorrow for esophageal varices screening if patient is still hospitalized or will plan for outpatient procedure if he is  discharged  Will need f/u CMP in 1 week and office follow up visit in 2-3 weeks on discharge   The patient was provided an opportunity to ask questions and all were answered. The patient agreed with the plan and demonstrated an understanding of the instructions.  K. VeeDamaris [phone]

## 2021-02-20 NOTE — Progress Notes (Signed)
PROGRESS NOTE    Adam Hartman  QMG:[id number] DOB: Sep 03, 1966 DOA: 02/16/2021 PCP: Juliette Alcide, MD   Brief Narrative:  Adam Hartman is a 55 y.o. male with history h/o hypertension, GERD, heavy alcohol use, anxiety/depression presented to GI clinic week earlier  with complaints of abdominal pain-diffuse with alcohol use until recently (quit more than  a month back).  Ultrasonogram performed in the clinic showed liver cirrhosis with small amount of ascites, splenomegaly.  Given complaints of severe pain he was referred to the ED-CT abdomen/pelvis obtained showing hepatic cirrhosis/mild to moderate ascites and findings consistent with portal venous hypertension, esophageal varices and splenomegaly.  Also reported on CT was a 2 cm right hepatic lobe ill-defined lesion, neoplasm could not be excluded per radiology and abdominal MRI recommended for further characterization.  Discharged home and ultimately presented back to the ED with recurrent severe abdominal pain this morning associated with nausea/dry heaving.  Hospitalist called for admission for pain control and further evaluation, GI consulted for assistance.  Assessment & Plan:   Principal Problem:   Abdominal pain Active Problems:   Alcohol abuse   Ascites   GERD (gastroesophageal reflux disease)   Cirrhosis (HCC)   Portal hypertension (HCC)   Secondary esophageal varices without bleeding (HCC)   Acute metabolic encephalopathy   Liver lesion   Abnormal MRI, liver  Acute intractable abdominal pain, in the setting of ascites  - Transition off IV narcotics, doing well on low-dose oxycodone - CT abdomen findings as above with ill-defined hepatic lesion, ascites - Continue Lasix, spironolactone, lactulose - GI following, appreciate insight and recommendations - EGD planned 02/21/2021 - Paracentesis labs unremarkable for infection, cytology pending; may require repeat paracentesis in the next few days if diuretics are not  effective - MRI is not amenable to biopsy.  Liver cirrhosis and unspecified liver lesion; both are new diagnoses, POA Concurrent hyperammonemia - Complicated by splenomegaly, portal hypertension, ascites and esophageal varices.   - GI following - Pending AFP, diagnostic paracentesis pending, ammonia minimally elevated initiate lactulose as above - Per MRI "not the typical features of hepatocellular carcinoma, although non-HCC malignancy such as intrahepatic cholangiocarcinoma or metastatic disease cannot be excluded. Recommend MR follow-up in 3 months. This lesion is likely too small to evaluate or biopsy by ultrasound" - Meld 16 (MeldNa 18)  Likely chronic sleep apnea, undiagnosed  - Lengthy discussion reveals he had been set up for sleep study on multiple occasions but never followed up, will offer nighttime oxygen/positive pressure as indicated.  Chronic alcohol abuse Reports  - Patient was a heavy drinker but reports quitting at least a month back (reports 2 month to myself today).  No signs of withdrawal currently.  Thiamine/multivitamin supplement ordered.  Acute toxic encephalopathy, resolved  Cannot rule out concurrent metabolic encephalopathy in the setting of hyperammonemia - Mental status resolved with narcan and decrease of narcotics - Continue decreased dose of narcotics as above - Ammonia downtrending - There was also some concern for alcohol withdrawal and possible postictal state however he has not reported drinking for some 2 months now and given resolution of symptoms with Narcan and again unlikely to be withdrawal related.  DVT prophylaxis: SCDs Code Status: Full Family Communication: None present  Status is: Inpatient   Dispo: The patient is from: Home              Anticipated d/c is to: Home tomorrow post EGD              Anticipated  d/c date is: 24 hours              Patient currently not medically stable for discharge  Consultants:   GI  Procedures:    Paracentesis 02/17/2021  Antimicrobials:  None  Subjective: No acute issues or events overnight denies nausea vomiting diarrhea constipation headache fevers chills chest pain shortness of breath.  Abdominal pain is markedly improving but not yet resolved.  Objective: Vitals:   02/19/21 1320 02/19/21 2132 02/20/21 0632 02/20/21 1327  BP: 133/68 129/71 131/82 135/75  Pulse: 84 77 78 72  Resp: 17 17 18 17   Temp: 98.6 F (37 C) 98.5 F (36.9 C) 98.2 F (36.8 C) 98.2 F (36.8 C)  TempSrc:  Oral Oral Oral  SpO2: 96% 95% 97% 96%  Weight:      Height:        Intake/Output Summary (Last 24 hours) at 02/20/2021 1509 Last data filed at 02/20/2021 1327 Gross per 24 hour  Intake 1860 ml  Output --  Net 1860 ml   Filed Weights   02/17/21 0926  Weight: 123.7 kg    Examination:  General:  Pleasantly resting in bed, No acute distress. HEENT:  Normocephalic atraumatic.  Sclerae nonicteric, noninjected.  Extraocular movements intact bilaterally. Neck:  Without mass or deformity.  Trachea is midline. Lungs:  Clear to auscultate bilaterally without rhonchi, wheeze, or rales. Heart:  Regular rate and rhythm.  Without murmurs, rubs, or gallops. Abdomen:  Soft, minimally tender diffusely without PMI, nondistended, nontympanic.  Without guarding or rebound. Extremities: Without cyanosis, clubbing, edema, or obvious deformity. Vascular:  Dorsalis pedis and posterior tibial pulses palpable bilaterally. Skin:  Warm and dry, no erythema, no ulcerations.    Data Reviewed: I have personally reviewed following labs and imaging studies  CBC: Recent Labs  Lab 02/15/21 1527 02/16/21 1349 02/17/21 0527 02/17/21 1103 02/18/21 0254 02/19/21 0410 02/20/21 0437  WBC 10.9* 9.1 17.3* 19.2* 11.8* 11.3* 11.0*  NEUTROABS 8.9* 6.7  --   --   --   --   --   HGB 15.0 15.6 15.5 14.7 12.9* 13.8 13.3  HCT 43.6 44.7 47.1 44.4 38.2* 41.2 38.3*  MCV 98.2 97.2 103.1* 101.6* 100.0 101.0* 98.7  PLT 176  180 192 175 137* 136* 128*   Basic Metabolic Panel: Recent Labs  Lab 02/17/21 0527 02/17/21 1103 02/18/21 0254 02/19/21 0410 02/20/21 0437  NA 135 132* 133* 132* 135  K 4.4 4.6 4.2 4.0 3.5  CL 101 100 101 98 100  CO2 23 22 25 26 27   GLUCOSE 146* 185* 114* 103* 123*  BUN 7 9 10 9 6   CREATININE 0.83 0.89 0.69 0.56* 0.52*  CALCIUM 8.8* 8.7* 8.4* 8.3* 8.5*   GFR: Estimated Creatinine Clearance: 141.4 mL/min (A) (by C-G formula based on SCr of 0.52 mg/dL (L)). Liver Function Tests: Recent Labs  Lab 02/17/21 0527 02/17/21 1103 02/18/21 0254 02/19/21 0410 02/20/21 0437  AST 86* 86* 63* 58* 55*  ALT 40 41 33 30 32  ALKPHOS 80 75 68 63 66  BILITOT 3.5* 3.9* 3.0* 3.3* 2.7*  PROT 7.7 7.2 6.7 6.1* 6.5  ALBUMIN 3.4* 3.3* 2.8* 2.7* 2.8*   Recent Labs  Lab 02/15/21 1527 02/16/21 1349  LIPASE 42 43   Recent Labs  Lab 02/15/21 1527 02/17/21 1103 02/18/21 0254 02/19/21 0410 02/20/21 0437  AMMONIA 31 95* 55* 78* 62*   Coagulation Profile: Recent Labs  Lab 02/16/21 1721 02/19/21 1108 02/20/21 0437  INR 1.6* 1.7* 1.7*  Cardiac Enzymes: No results for input(s): CKTOTAL, CKMB, CKMBINDEX, TROPONINI in the last 168 hours. BNP (last 3 results) No results for input(s): PROBNP in the last 8760 hours. HbA1C: No results for input(s): HGBA1C in the last 72 hours. CBG: Recent Labs  Lab 02/17/21 0855  GLUCAP 190*   Lipid Profile: No results for input(s): CHOL, HDL, LDLCALC, TRIG, CHOLHDL, LDLDIRECT in the last 72 hours. Thyroid Function Tests: No results for input(s): TSH, T4TOTAL, FREET4, T3FREE, THYROIDAB in the last 72 hours. Anemia Panel: No results for input(s): VITAMINB12, FOLATE, FERRITIN, TIBC, IRON, RETICCTPCT in the last 72 hours. Sepsis Labs: Recent Labs  Lab 02/15/21 1554 02/15/21 1754  LATICACIDVEN 2.9* 1.9    Recent Results (from the past 240 hour(s))  SARS CORONAVIRUS 2 (TAT 6-24 HRS) Nasopharyngeal Nasopharyngeal Swab     Status: None    Collection Time: 02/15/21  3:28 PM   Specimen: Nasopharyngeal Swab  Result Value Ref Range Status   SARS Coronavirus 2 NEGATIVE NEGATIVE Final    Comment: (NOTE) SARS-CoV-2 target nucleic acids are NOT DETECTED.  The SARS-CoV-2 RNA is generally detectable in upper and lower respiratory specimens during the acute phase of infection. Negative results do not preclude SARS-CoV-2 infection, do not rule out co-infections with other pathogens, and should not be used as the sole basis for treatment or other patient management decisions. Negative results must be combined with clinical observations, patient history, and epidemiological information. The expected result is Negative.  Fact Sheet for Patients: HairSlick.nohttps://www.fda.gov/media/138098/download  Fact Sheet for Healthcare Providers: quierodirigir.comhttps://www.fda.gov/media/138095/download  This test is not yet approved or cleared by the [place] FDA and  has been authorized for detection and/or diagnosis of SARS-CoV-2 by FDA under an Emergency Use Authorization (EUA). This EUA will remain  in effect (meaning this test can be used) for the duration of the COVID-19 declaration under Se ction 564(b)(1) of the Act, 21 U.S.C. section 360bbb-3(b)(1), unless the authorization is terminated or revoked sooner.  Performed at [facility], [address] [place], [place] [address]   Blood culture (routine x 2)     Status: None   Collection Time: 02/15/21  3:54 PM   Specimen: Site Not Specified; Blood  Result Value Ref Range Status   Specimen Description   Final    SITE NOT SPECIFIED Performed at [facility], [address] [place], [place] [address]    Special Requests   Final    BOTTLES DRAWN AEROBIC AND ANAEROBIC Blood Culture adequate volume Performed at [facility], [address] [place], [place] [address]    Culture   Final    NO GROWTH 5 DAYS Performed at [facility], [address] [place], [place] [address]    Report Status 02/20/2021 FINAL  Final  Blood culture (routine x 2)     Status: None   Collection Time: 02/15/21  4:03 PM   Specimen: Site Not Specified; Blood  Result Value Ref Range Status   Specimen Description   Final    SITE NOT SPECIFIED Performed at [facility], [address] [place], [place] [address]    Special Requests   Final    BOTTLES DRAWN AEROBIC AND ANAEROBIC Blood Culture adequate volume Performed at [facility], [address] [place], [place] [address]    Culture   Final    NO GROWTH 5 DAYS Performed at [facility], [address] [place], [place] [address]  Report Status 02/20/2021 FINAL  Final  MRSA PCR Screening     Status: None   Collection Time: 02/17/21 12:16 PM   Specimen: Nasopharyngeal  Result Value Ref Range Status   MRSA by PCR NEGATIVE NEGATIVE Final    Comment:        The GeneXpert MRSA Assay (FDA approved for NASAL specimens only), is one component of a comprehensive MRSA colonization surveillance program. It is not intended to diagnose MRSA infection nor to guide or monitor treatment for MRSA infections. Performed at [facility], [address], [place], [place] [address]   Culture, body fluid w Gram Stain-bottle     Status: None (Preliminary result)   Collection Time: 02/17/21  1:12 PM   Specimen: Peritoneal Washings  Result Value Ref Range Status   Specimen Description PERITONEAL  Final   Special Requests NONE  Final   Culture   Final    NO GROWTH 3 DAYS Performed at [facility], [address], [place], [place] [address]    Report Status PENDING  Incomplete  Gram stain     Status: None   Collection Time: 02/17/21  1:12 PM   Specimen: Peritoneal Washings  Result Value Ref Range Status   Specimen Description PERITONEAL  Final   Special Requests NONE  Final   Gram Stain   Final    WBC PRESENT,BOTH PMN AND MONONUCLEAR NO  ORGANISMS SEEN CYTOSPIN SMEAR Performed at [facility], [address], [place], [place] [address]    Report Status 02/17/2021 FINAL  Final    Radiology Studies: No results found.  Scheduled Meds: . Chlorhexidine Gluconate Cloth  6 each Topical Daily  . [START ON 02/21/2021] furosemide  40 mg Oral Daily  . lactulose  20 g Oral BID  . oxyCODONE  10 mg Oral Q12H  . pantoprazole  40 mg Oral Daily  . [START ON 02/21/2021] spironolactone  100 mg Oral Daily  . spironolactone  50 mg Oral Once  . thiamine  100 mg Oral Daily    LOS: 3 days   Time spent: 55 min  Azucena Fallen, DO [facility]  If 7PM-7AM, please contact night-coverage [email or web address]  02/20/2021, 3:09 PM

## 2021-02-21 ENCOUNTER — Inpatient Hospital Stay (HOSPITAL_COMMUNITY): Payer: Self-pay | Admitting: Anesthesiology

## 2021-02-21 ENCOUNTER — Encounter (HOSPITAL_COMMUNITY): Payer: Self-pay | Admitting: Internal Medicine

## 2021-02-21 ENCOUNTER — Encounter (HOSPITAL_COMMUNITY)
Admission: EM | Disposition: A | Discharge status: Home / Self Care | Payer: Self-pay | Source: Home / Self Care | Attending: Internal Medicine

## 2021-02-21 HISTORY — PX: ESOPHAGOGASTRODUODENOSCOPY (EGD) WITH PROPOFOL: SHX5813

## 2021-02-21 LAB — COMPREHENSIVE METABOLIC PANEL
ALT: 30 U/L (ref 0–44)
AST: 54 U/L — ABNORMAL HIGH (ref 15–41)
Albumin: 3 g/dL — ABNORMAL LOW (ref 3.5–5.0)
Alkaline Phosphatase: 62 U/L (ref 38–126)
Anion gap: 8 (ref 5–15)
BUN: 6 mg/dL (ref 6–20)
CO2: 26 mmol/L (ref 22–32)
Calcium: 8.5 mg/dL — ABNORMAL LOW (ref 8.9–10.3)
Chloride: 99 mmol/L (ref 98–111)
Creatinine, Ser: 0.6 mg/dL — ABNORMAL LOW (ref 0.61–1.24)
GFR, Estimated: >60 mL/min (ref >60)
Glucose, Bld: 108 mg/dL — ABNORMAL HIGH (ref 70–99)
Potassium: 3.7 mmol/L (ref 3.5–5.1)
Sodium: 133 mmol/L — ABNORMAL LOW (ref 135–145)
Total Bilirubin: 3.5 mg/dL — ABNORMAL HIGH (ref 0.3–1.2)
Total Protein: 6.6 g/dL (ref 6.5–8.1)

## 2021-02-21 LAB — CBC
HCT: 41.5 % (ref 39.0–52.0)
Hemoglobin: 13.9 g/dL (ref 13.0–17.0)
MCH: 34 pg (ref 26.0–34.0)
MCHC: 33.5 g/dL (ref 30.0–36.0)
MCV: 101.5 fL — ABNORMAL HIGH (ref 80.0–100.0)
Platelets: 137 10*3/uL — ABNORMAL LOW (ref 150–400)
RBC: 4.09 MIL/uL — ABNORMAL LOW (ref 4.22–5.81)
RDW: 13.9 % (ref 11.5–15.5)
WBC: 10.4 10*3/uL (ref 4.0–10.5)
nRBC: 0 % (ref 0.0–0.2)

## 2021-02-21 LAB — CANCER ANTIGEN 19-9: CA 19-9: <2 U/mL (ref 0–35)

## 2021-02-21 LAB — PROTIME-INR
INR: 1.5 — ABNORMAL HIGH (ref 0.8–1.2)
Prothrombin Time: 18.5 seconds — ABNORMAL HIGH (ref 11.4–15.2)

## 2021-02-21 LAB — CEA: CEA: 4.6 ng/mL (ref 0.0–4.7)

## 2021-02-21 LAB — AMMONIA: Ammonia: 62 umol/L — ABNORMAL HIGH (ref 9–35)

## 2021-02-21 SURGERY — ESOPHAGOGASTRODUODENOSCOPY (EGD) WITH PROPOFOL
Anesthesia: Monitor Anesthesia Care

## 2021-02-21 MED ORDER — LACTULOSE 10 GM/15ML PO SOLN: 20.0000 g | Freq: Two times a day (BID) | ORAL | 1 refills | Status: DC

## 2021-02-21 MED ORDER — SPIRONOLACTONE 100 MG PO TABS: 100.0000 mg | ORAL_TABLET | Freq: Every day | ORAL | 1 refills | Status: DC

## 2021-02-21 MED ORDER — OXYCODONE HCL ER 10 MG PO T12A: 10.0000 mg | EXTENDED_RELEASE_TABLET | Freq: Two times a day (BID) | ORAL | 0 refills | Status: DC

## 2021-02-21 MED ORDER — PROSIGHT PO TABS: 1.0000 | ORAL_TABLET | Freq: Every day | ORAL | 0 refills | Status: AC

## 2021-02-21 MED ORDER — FOLIC ACID 1 MG PO TABS
1.0000 mg | ORAL_TABLET | Freq: Every day | ORAL | Status: DC
  Administered 2021-02-21: 1 mg via ORAL
  Filled 2021-02-21: qty 1

## 2021-02-21 MED ORDER — TRAMADOL HCL 50 MG PO TABS: 50.0000 mg | ORAL_TABLET | Freq: Three times a day (TID) | ORAL | 0 refills | Status: DC | PRN

## 2021-02-21 MED ORDER — NADOLOL 20 MG PO TABS: 20.0000 mg | ORAL_TABLET | Freq: Every day | ORAL | 1 refills | Status: DC

## 2021-02-21 MED ORDER — ONDANSETRON HCL 4 MG/2ML IJ SOLN
INTRAMUSCULAR | Status: DC | PRN
  Administered 2021-02-21: 4 mg via INTRAVENOUS

## 2021-02-21 MED ORDER — THIAMINE HCL 100 MG PO TABS: 100.0000 mg | ORAL_TABLET | Freq: Every day | ORAL | Status: DC

## 2021-02-21 MED ORDER — PANTOPRAZOLE SODIUM 40 MG PO TBEC: 40.0000 mg | DELAYED_RELEASE_TABLET | Freq: Every day | ORAL | 1 refills | Status: DC

## 2021-02-21 MED ORDER — NADOLOL 20 MG PO TABS
20.0000 mg | ORAL_TABLET | Freq: Every day | ORAL | Status: DC
  Filled 2021-02-21: qty 1

## 2021-02-21 MED ORDER — SODIUM CHLORIDE 0.9 % IV SOLN: INTRAVENOUS | Status: DC

## 2021-02-21 MED ORDER — LACTATED RINGERS IV SOLN: INTRAVENOUS | Status: DC

## 2021-02-21 MED ORDER — FOLIC ACID 1 MG PO TABS: 1.0000 mg | ORAL_TABLET | Freq: Every day | ORAL | Status: DC

## 2021-02-21 MED ORDER — FUROSEMIDE 40 MG PO TABS: 40.0000 mg | ORAL_TABLET | Freq: Every day | ORAL | 1 refills | Status: DC

## 2021-02-21 MED ORDER — PROSIGHT PO TABS
1.0000 | ORAL_TABLET | Freq: Every day | ORAL | Status: DC
  Administered 2021-02-21: 1 via ORAL
  Filled 2021-02-21: qty 1

## 2021-02-21 MED ORDER — PROPOFOL 500 MG/50ML IV EMUL
INTRAVENOUS | Status: DC | PRN
  Administered 2021-02-21: 175 ug/kg/min via INTRAVENOUS

## 2021-02-21 SURGICAL SUPPLY — 15 items

## 2021-02-21 NOTE — Transfer of Care (Signed)
Immediate Anesthesia Transfer of Care Note  Patient: Adam Hartman  Procedure(s) Performed: ESOPHAGOGASTRODUODENOSCOPY (EGD) WITH PROPOFOL (N/A )  Patient Location: PACU  Anesthesia Type:MAC  Level of Consciousness: sedated, patient cooperative and responds to stimulation  Airway & Oxygen Therapy: Patient Spontanous Breathing and Patient connected to face mask oxygen  Post-op Assessment: Report given to RN and Post -op Vital signs reviewed and stable  Post vital signs: Reviewed and stable  Last Vitals:  Vitals Value Taken Time  BP 119/49 02/21/21 1323  Temp 36.5 C 02/21/21 1323  Pulse 86 02/21/21 1324  Resp 25 02/21/21 1324  SpO2 95 % 02/21/21 1324  Vitals shown include unvalidated device data.  Last Pain:  Vitals:   02/21/21 1323  TempSrc: Oral  PainSc: 0-No pain      Patients Stated Pain Goal: 2 (62/22/97 9892)  Complications: No complications documented.

## 2021-02-21 NOTE — Plan of Care (Signed)

## 2021-02-21 NOTE — Discharge Summary (Addendum)
Physician Discharge Summary  Adam Hartman SWN:[id number] DOB: August 28, 1966 DOA: 02/16/2021  PCP: Juliette Alcide, MD  Admit date: 02/16/2021 Discharge date: 02/21/2021  Time spent: 60 minutes  Recommendations for Outpatient Follow-up:  1. Follow-up with Juliette Alcide, MD in 2 weeks.  On follow-up patient need a comprehensive metabolic profile done to follow-up on electrolytes and renal function and liver function..  Patient may benefit from referral back to sleep study to rule out OSA. 2. Follow-up with Dr. Lavon Paganini, gastroenterology in 2 to 3 weeks.  Patient will need repeat MRI of the liver done in about 3 months to follow-up on lesion.  Lab work to be obtained on follow-up per GI.   Discharge Diagnoses:  Principal Problem:   Abdominal pain Active Problems:   Alcohol abuse   Ascites   GERD (gastroesophageal reflux disease)   Cirrhosis (HCC)   Portal hypertension (HCC)   Secondary esophageal varices without bleeding (HCC)   Acute metabolic encephalopathy   Liver lesion   Abnormal MRI, liver   Discharge Condition: Stable and improved  Diet recommendation: Heart healthy  Filed Weights   02/17/21 0926  Weight: 123.7 kg    History of present illness:  HPI per Dr. Elvis Coil is a 55 y.o. male with history h/o hypertension, GERD, heavy alcohol use, anxiety/depression presented to GI clinic 1 day prior to admission, with complaints of abdominal pain-diffuse with alcohol use until recently (quit more than  a month back).  Ultrasonogram performed in the clinic showed liver cirrhosis with small amount of ascites, splenomegaly.  Given complaints of severe pain he was referred to the ED-CT abdomen/pelvis obtained showing hepatic cirrhosis/mild to moderate ascites and findings consistent with portal venous hypertension, esophageal varices and splenomegaly.  Also reported on CT was a 2 cm right hepatic lobe ill-defined lesion, neoplasm could not be excluded per radiology  and abdominal MRI recommended for further characterization.  Patient was given pain medications with which he improved and was discharged home, but presented back to the ED with recurrent severe abdominal pain the morning of admission, associated with nausea/dry heaving. Labs showed normal CBC, chemistry panel shows-sodium 134, total bili 3.6, ALP 84, ALT 42, AST 94, albumin 3.3, lipase WNL.  UA in the ED unremarkable yesterday. GI recommended admission for pain control and further work-up with MRI/IR assisted paracentesis for new onset cirrhosis.  Patient currently reported epigastric-mid abdominal pain, 6/10, radiating to flank and back.  Denies any tremors, melena, hematochezia.  Awake alert oriented and able to participate in interview/physical exam.    Hospital Course:  1 acute intractable abdominal pain, in the setting of ascites -Patient had presented with acute abdominal pain in the setting of ascites, CT abdomen and pelvis done showed hepatic cirrhosis/mild to moderate ascites findings consistent with portal venous hypertension, esophageal varices and splenomegaly, ill-defined hepatic lesion. -GI consulted and patient started on Lasix, spironolactone, lactulose. -Ultrasound-guided paracentesis which was done was unremarkable for infection, cytology was negative for malignancy. -MRI done of the abdomen showed a small lesion not amenable to biopsy recommended repeat MRI in 3 months. -Patient placed on oxycodone twice daily as well as Ultram as needed. -Initial IV narcotics which was started were subsequently discontinued. -Patient's abdominal pain improved. -Patient will be discharged on 5 days of OxyContin twice daily as well as Ultram as needed. -Outpatient follow-up with GI and PCP.  2.  Liver cirrhosis and unspecified liver lesion, new diagnosis, POA/concurrent hyperammonemia (MELD Na score 18)/grade 2 esophageal varices -Patient had presented  with abdominal pain.  CT abdomen and  pelvis which was done was concerning for newly diagnosis of liver cirrhosis, unspecified lesion complicated by splenomegaly, portal hypertension, ascites and esophageal varices. -Patient underwent ultrasound-guided paracentesis which was negative for infectious etiology.  Cytology was negative for malignancy. -Ammonia level noted to be minimally elevated and patient started on lactulose. -Patient also started on Lasix and spironolactone and dose uptitrated per GI. -MRI abdomen which was done " not the typical features of hepatocellular carcinoma, although known HCC malignancy, such as intrahepatic cholangiocarcinoma metastatic disease cannot be excluded.  Recommend MRI follow-up in 3 months.  Lesion likely too small to evaluate on biopsy by ultrasound." -Patient subsequently underwent upper endoscopy on 02/21/2021 which showed grade 2 and small esophageal varices.  Patient subsequently started on nadolol per GI. -Outpatient follow-up with GI.  3.  Likely chronic sleep apnea, undiagnosed -Dr. Natale MilchLancaster had a lengthy conversation with patient and it revealed that patient has been set up for sleep study on multiple occasions but never followed up. -Outpatient follow-up with PCP.  4.  Chronic alcohol abuse -Patient noted to be a heavy drinker but reported quitting at least 2 months ago. -Patient with no overt signs of withdrawal noted. -Patient congratulated on alcohol cessation. -Patient maintained on thiamine, folic acid, multivitamin. -Outpatient follow-up.  5.  Acute toxic encephalopathy, resolved -Patient noted to have acute toxic metabolic encephalopathy that resolved with Narcan and decreasing narcotics. -Patient also placed on lactulose with ammonia levels which are downtrending. -Outpatient follow-up.  6.  GERD -Patient maintained on PPI.  Procedures:  Upper endoscopy 02/21/2021 per Dr. Lavon PaganiniNandigam  CT abdomen and pelvis 02/15/2021  MRI liver 02/16/2021  Ultrasound guided  paracentesis--2.1 L removed per IR Dr. Archer AsaMcCullough 02/17/2021  Consultations:  Gastroenterology: Dr. Meridee ScoreMansouraty 02/16/2021  Discharge Exam: Vitals:   02/21/21 1340 02/21/21 1350  BP: (!) 164/57 139/60  Pulse: (!) 104 (!) 102  Resp: (!) 26 18  Temp:    SpO2: 97% 95%    General: NAD Cardiovascular: RRR Respiratory: CTAB  Discharge Instructions   Discharge Instructions    Diet - low sodium heart healthy   Complete by: As directed    Increase activity slowly   Complete by: As directed      Allergies as of 02/21/2021      Reactions   Atenolol    Codeine Itching   Wellbutrin [bupropion]       Medication List    TAKE these medications   folic acid 1 MG tablet Commonly known as: FOLVITE Take 1 tablet (1 mg total) by mouth daily. Start taking on: Feb 22, 2021   furosemide 40 MG tablet Commonly known as: LASIX Take 1 tablet (40 mg total) by mouth daily. Start taking on: Feb 22, 2021   lactulose 10 GM/15ML solution Commonly known as: CHRONULAC Take 30 mLs (20 g total) by mouth 2 (two) times daily.   LORazepam 0.5 MG tablet Commonly known as: ATIVAN Take 0.5 mg by mouth every 8 (eight) hours as needed for anxiety.   multivitamin Tabs tablet Take 1 tablet by mouth daily. Start taking on: Feb 22, 2021   nadolol 20 MG tablet Commonly known as: CORGARD Take 1 tablet (20 mg total) by mouth daily.   oxyCODONE 10 mg 12 hr tablet Commonly known as: OXYCONTIN Take 1 tablet (10 mg total) by mouth every 12 (twelve) hours.   pantoprazole 40 MG tablet Commonly known as: PROTONIX Take 1 tablet (40 mg total) by mouth daily. Start taking  on: Feb 22, 2021 What changed:   when to take this  reasons to take this   spironolactone 100 MG tablet Commonly known as: ALDACTONE Take 1 tablet (100 mg total) by mouth daily. Start taking on: Feb 22, 2021   thiamine 100 MG tablet Take 1 tablet (100 mg total) by mouth daily. Start taking on: Feb 22, 2021   traMADol 50 MG  tablet Commonly known as: ULTRAM Take 1 tablet (50 mg total) by mouth every 8 (eight) hours as needed for moderate pain (mild pain).      Allergies  Allergen Reactions  . Atenolol   . Codeine Itching  . Wellbutrin [Bupropion]     Follow-up Information    Burdine, Ananias Pilgrim, MD. Schedule an appointment as soon as possible for a visit in 2 week(s).   Specialty: Family Medicine Contact information: [address] [place] [address] [phone]        Napoleon Form, MD. Schedule an appointment as soon as possible for a visit in 2 week(s).   Specialty: Gastroenterology Why: Follow-up in 2 to 3 weeks.  Office will call with appointment time Contact information: [address] [place] [address] [phone]                The results of significant diagnostics from this hospitalization (including imaging, microbiology, ancillary and laboratory) are listed below for reference.    Significant Diagnostic Studies: CT ABDOMEN PELVIS W CONTRAST  Result Date: 02/15/2021 CLINICAL DATA:  Severe abdominal pain and distention. Alcoholic cirrhosis. EXAM: CT ABDOMEN AND PELVIS WITH CONTRAST TECHNIQUE: Multidetector CT imaging of the abdomen and pelvis was performed using the standard protocol following bolus administration of intravenous contrast. CONTRAST:  OMNIPAQUE IOHEXOL 300 MG/ML  SOLN COMPARISON:  None. FINDINGS: Lower Chest: No acute findings. Hepatobiliary: Hepatic cirrhosis is demonstrated. An ill-defined low-attenuation lesion is seen in the posterior right hepatic lobe measuring approximately 2 cm on image 43/2. A tiny cyst is seen in the anterior liver dome. Recanalization of paraumbilical veins is consistent with portal venous hypertension. Gallbladder is unremarkable. No evidence of biliary ductal dilatation. Pancreas:  No mass or inflammatory changes. Spleen: Moderate splenomegaly with length measuring 15-16 cm. No masses identified. Adrenals/Urinary Tract: No  masses identified. No evidence of ureteral calculi or hydronephrosis. Stomach/Bowel: No evidence of obstruction, inflammatory process or abnormal fluid collections. Vascular/Lymphatic: No pathologically enlarged lymph nodes. No acute vascular findings. Aortic atherosclerotic calcification noted. Abdominal portosystemic venous collaterals are seen within the abdomen including esophageal varices, consistent with portal venous hypertension. Reproductive:  No mass or other significant abnormality. Other:  Mild-to-moderate ascites seen within the abdomen pelvis. Musculoskeletal:  No suspicious bone lesions identified. IMPRESSION: Hepatic cirrhosis, and findings of portal venous hypertension including esophageal varices and splenomegaly. Mild-to-moderate ascites. 2 cm ill-defined low-attenuation lesion in posterior right hepatic lobe. Neoplasm cannot be excluded. Abdomen MRI without and with contrast recommended for further characterization. Aortic Atherosclerosis (ICD10-I70.0). Electronically Signed   By: Danae Orleans M.D.   On: 02/15/2021 18:21   MR LIVER W WO CONTRAST  Result Date: 02/16/2021 CLINICAL DATA:  Abdominal pain. History of cirrhosis and portal hypertension. Liver lesion on CT EXAM: MRI ABDOMEN WITHOUT AND WITH CONTRAST TECHNIQUE: Multiplanar multisequence MR imaging of the abdomen was performed both before and after the administration of intravenous contrast. CONTRAST:  74mL GADAVIST GADOBUTROL 1 MMOL/ML IV SOLN COMPARISON:  Abdominopelvic CT 02/15/2021. Abdominal ultrasound 01/19/2021. FINDINGS: Image quality is mildly degraded by motion artifact. Lower chest: The  visualized lower chest demonstrates no significant findings. Hepatobiliary: Mild hepatomegaly with the liver measuring up to 24 cm in height. There are mild morphologic changes of cirrhosis. There is a simple cyst anteriorly in the dome of the right hepatic lobe which measures 1.4 cm on image 6/11. There are additional tiny cysts. The lesion  of concern inferiorly in the right hepatic lobe corresponds with an ill-defined area of mildly increased T2 signal measuring 1.8 cm on image 40/11. No arterial phase enhancement of this lesion is seen, although there is delayed ring enhancement with a small central area of nonenhancement measuring 7 mm on image 71/25. No other focal liver lesions are identified. No evidence of gallstones, gallbladder wall thickening or biliary dilatation. Pancreas: Unremarkable. No pancreatic ductal dilatation or surrounding inflammatory changes. Spleen: Mild to moderate splenomegaly without focal abnormality. Adrenals/Urinary Tract: Both adrenal glands appear normal. No suspicious renal findings. No evidence of renal mass or hydronephrosis. Stomach/Bowel: The stomach appears unremarkable for its degree of distension. No evidence of bowel wall thickening, distention or surrounding inflammatory change. Vascular/Lymphatic: Several prominent lymph nodes in the porta hepatis are similar to recent CT and likely reactive given the patient's cirrhosis. No retroperitoneal lymphadenopathy. No acute vascular findings identified. Gastric and distal esophageal varices again noted consistent with portal hypertension. The portal, superior mesenteric and splenic veins are patent. Other: Mild-to-moderate ascites. Musculoskeletal: No acute or significant osseous findings. IMPRESSION: 1. The previously demonstrated lesion of concern inferiorly in the right hepatic lobe has nonspecific MR features with delayed ring enhancement and ill-defined T2 signal. These are not the typical features of hepatocellular carcinoma, although non-HCC malignancy such as intrahepatic cholangiocarcinoma or metastatic disease cannot be excluded. Recommend MR follow-up in 3 months. This lesion is likely too small to evaluate or biopsy by ultrasound. 2. Morphologic changes of cirrhosis with secondary signs of portal hypertension. Mild-to-moderate ascites. 3. Prominent lymph  nodes in the porta hepatis, likely reactive. Electronically Signed   By: Carey Bullocks M.D.   On: 02/16/2021 19:58   US Paracentesis  Result Date: 02/17/2021 INDICATION: Ascites EXAM: ULTRASOUND GUIDED LLQ PARACENTESIS MEDICATIONS: 10 cc 1% lidocaine. COMPLICATIONS: None immediate. PROCEDURE: Informed written consent was obtained from the patient after a discussion of the risks, benefits and alternatives to treatment. A timeout was performed prior to the initiation of the procedure. Initial ultrasound scanning demonstrates a large amount of ascites within the left lower abdominal quadrant. The left lower abdomen was prepped and draped in the usual sterile fashion. 1% lidocaine was used for local anesthesia. Following this, a Yueh catheter was introduced. An ultrasound image was saved for documentation purposes. The paracentesis was performed. The catheter was removed and a dressing was applied. The patient tolerated the procedure well without immediate post procedural complication. Patient received post-procedure intravenous albumin; see nursing notes for details. FINDINGS: A total of approximately 2.1 liters of dark yellow fluid was removed. Samples were sent to the laboratory as requested by the clinical team. IMPRESSION: Successful ultrasound-guided paracentesis yielding 2.1 liters of peritoneal fluid. Read by Robet Leu [facility] Electronically Signed   By: Malachy Moan M.D.   On: 02/17/2021 14:47    Microbiology: Recent Results (from the past 240 hour(s))  SARS CORONAVIRUS 2 (TAT 6-24 HRS) Nasopharyngeal Nasopharyngeal Swab     Status: None   Collection Time: 02/15/21  3:28 PM   Specimen: Nasopharyngeal Swab  Result Value Ref Range Status   SARS Coronavirus 2 NEGATIVE NEGATIVE Final    Comment: (NOTE) SARS-CoV-2 target nucleic  acids are NOT DETECTED.  The SARS-CoV-2 RNA is generally detectable in upper and lower respiratory specimens during the acute phase of infection. Negative results  do not preclude SARS-CoV-2 infection, do not rule out co-infections with other pathogens, and should not be used as the sole basis for treatment or other patient management decisions. Negative results must be combined with clinical observations, patient history, and epidemiological information. The expected result is Negative.  Fact Sheet for Patients: HairSlick.no  Fact Sheet for Healthcare Providers: quierodirigir.com  This test is not yet approved or cleared by the [place] FDA and  has been authorized for detection and/or diagnosis of SARS-CoV-2 by FDA under an Emergency Use Authorization (EUA). This EUA will remain  in effect (meaning this test can be used) for the duration of the COVID-19 declaration under Se ction 564(b)(1) of the Act, 21 U.S.C. section 360bbb-3(b)(1), unless the authorization is terminated or revoked sooner.  Performed at [facility], [address] [place], [place] [address]   Blood culture (routine x 2)     Status: None   Collection Time: 02/15/21  3:54 PM   Specimen: Site Not Specified; Blood  Result Value Ref Range Status   Specimen Description   Final    SITE NOT SPECIFIED Performed at [facility], [address], [place], [place] [address]    Special Requests   Final    BOTTLES DRAWN AEROBIC AND ANAEROBIC Blood Culture adequate volume Performed at [facility], [address] [place], [place] [address]    Culture   Final    NO GROWTH 5 DAYS Performed at [facility], [address], [place], [place] [address]    Report Status 02/20/2021 FINAL  Final  Blood culture (routine x 2)     Status: None   Collection Time: 02/15/21  4:03 PM   Specimen: Site Not Specified; Blood  Result Value Ref Range Status   Specimen Description   Final    SITE NOT SPECIFIED Performed at [facility], [address] [place],  [place] [address]    Special Requests   Final    BOTTLES DRAWN AEROBIC AND ANAEROBIC Blood Culture adequate volume Performed at [facility], [address] [place], [place] [address]    Culture   Final    NO GROWTH 5 DAYS Performed at [facility], [address] [place], [place] [address]    Report Status 02/20/2021 FINAL  Final  MRSA PCR Screening     Status: None   Collection Time: 02/17/21 12:16 PM   Specimen: Nasal Mucosa; Nasopharyngeal  Result Value Ref Range Status   MRSA by PCR NEGATIVE NEGATIVE Final    Comment:        The GeneXpert MRSA Assay (FDA approved for NASAL specimens only), is one component of a comprehensive MRSA colonization surveillance program. It is not intended to diagnose MRSA infection nor to guide or monitor treatment for MRSA infections. Performed at [facility], [address], [place], [place] [address]   Culture, body fluid w Gram Stain-bottle     Status: None (Preliminary result)   Collection Time: 02/17/21  1:12 PM   Specimen: Peritoneal Washings  Result Value Ref Range Status   Specimen Description PERITONEAL  Final   Special Requests NONE  Final   Culture   Final    NO GROWTH 4 DAYS Performed at [facility], [address]  [place], [place] [address]    Report Status PENDING  Incomplete  Gram stain     Status: None   Collection Time: 02/17/21  1:12 PM   Specimen: Peritoneal Washings  Result Value Ref Range Status   Specimen Description PERITONEAL  Final   Special Requests NONE  Final   Gram Stain   Final    WBC PRESENT,BOTH PMN AND MONONUCLEAR NO ORGANISMS SEEN CYTOSPIN SMEAR Performed at [facility], [address] [place], [place] [address]    Report Status 02/17/2021 FINAL  Final     Labs: Basic Metabolic Panel: Recent Labs  Lab 02/17/21 1103 02/18/21 0254 02/19/21 0410 02/20/21 0437 02/21/21 0439  NA 132* 133* 132* 135 133*  K 4.6 4.2 4.0 3.5 3.7  CL 100 101  98 100 99  CO2 22 25 26 27 26   GLUCOSE 185* 114* 103* 123* 108*  BUN 9 10 9 6 6   CREATININE 0.89 0.69 0.56* 0.52* 0.60*  CALCIUM 8.7* 8.4* 8.3* 8.5* 8.5*   Liver Function Tests: Recent Labs  Lab 02/17/21 1103 02/18/21 0254 02/19/21 0410 02/20/21 0437 02/21/21 0439  AST 86* 63* 58* 55* 54*  ALT 41 33 30 32 30  ALKPHOS 75 68 63 66 62  BILITOT 3.9* 3.0* 3.3* 2.7* 3.5*  PROT 7.2 6.7 6.1* 6.5 6.6  ALBUMIN 3.3* 2.8* 2.7* 2.8* 3.0*   Recent Labs  Lab 02/15/21 1527 02/16/21 1349  LIPASE 42 43   Recent Labs  Lab 02/17/21 1103 02/18/21 0254 02/19/21 0410 02/20/21 0437 02/21/21 0439  AMMONIA 95* 55* 78* 62* 62*   CBC: Recent Labs  Lab 02/15/21 1527 02/16/21 1349 02/17/21 0527 02/17/21 1103 02/18/21 0254 02/19/21 0410 02/20/21 0437 02/21/21 0439  WBC 10.9* 9.1   < > 19.2* 11.8* 11.3* 11.0* 10.4  NEUTROABS 8.9* 6.7  --   --   --   --   --   --   HGB 15.0 15.6   < > 14.7 12.9* 13.8 13.3 13.9  HCT 43.6 44.7   < > 44.4 38.2* 41.2 38.3* 41.5  MCV 98.2 97.2   < > 101.6* 100.0 101.0* 98.7 101.5*  PLT 176 180   < > 175 137* 136* 128* 137*   < > = values in this interval not displayed.   Cardiac Enzymes: No results for input(s): CKTOTAL, CKMB, CKMBINDEX, TROPONINI in the last 168 hours. BNP: BNP (last 3 results) No results for input(s): BNP in the last 8760 hours.  ProBNP (last 3 results) No results for input(s): PROBNP in the last 8760 hours.  CBG: Recent Labs  Lab 02/17/21 0855  GLUCAP 190*       Signed:  04/23/21 MD.  [facility] 02/21/2021, 3:42 PM

## 2021-02-21 NOTE — Interval H&P Note (Signed)
History and Physical Interval Note:  02/21/2021 12:30 PM  De Burrs  has presented today for surgery, with the diagnosis of Cirrhosis, assess for varices, upper abdominal pain.  The various methods of treatment have been discussed with the patient and family. After consideration of risks, benefits and other options for treatment, the patient has consented to  Procedure(s): ESOPHAGOGASTRODUODENOSCOPY (EGD) WITH PROPOFOL (N/A) as a surgical intervention.  The patient's history has been reviewed, patient examined, no change in status, stable for surgery.  I have reviewed the patient's chart and labs.  Questions were answered to the patient's satisfaction.     Chrystian Ressler

## 2021-02-21 NOTE — Anesthesia Postprocedure Evaluation (Signed)
Anesthesia Post Note  Patient: Adam Hartman  Procedure(s) Performed: ESOPHAGOGASTRODUODENOSCOPY (EGD) WITH PROPOFOL (N/A )     Patient location during evaluation: PACU Anesthesia Type: MAC Level of consciousness: awake and alert Pain management: pain level controlled Vital Signs Assessment: post-procedure vital signs reviewed and stable Respiratory status: spontaneous breathing, nonlabored ventilation, respiratory function stable and patient connected to nasal cannula oxygen Cardiovascular status: stable and blood pressure returned to baseline Postop Assessment: no apparent nausea or vomiting Anesthetic complications: no   No complications documented.  Last Vitals:  Vitals:   02/21/21 1340 02/21/21 1350  BP: (!) 164/57 139/60  Pulse: (!) 104 (!) 102  Resp: (!) 26 18  Temp:    SpO2: 97% 95%    Last Pain:  Vitals:   02/21/21 1350  TempSrc:   PainSc: 0-No pain                 Labradford Schnitker DANIEL

## 2021-02-21 NOTE — Evaluation (Signed)
Physical Therapy Evaluation-1x Patient Details Name: Adam Hartman MRN: [id number] DOB: 19-Sep-1966 Today's Date: 02/21/2021   History of Present Illness  55 yo male admitted with abd pain, ascites, acute toxic encephalopathy. Hx of ETOH abuse, cirrhosis, anxiety, depression, portal HTN, sleep apnea, ascites  Clinical Impression  On eval, pt was Ind with mobility. He walked around the unit with difficulty. He has been mobilizing in room unassisted. No PT needs. 1x eval.     Follow Up Recommendations No PT follow up    Equipment Recommendations  None recommended by PT    Recommendations for Other Services       Precautions / Restrictions Precautions Precautions: None Restrictions Weight Bearing Restrictions: No      Mobility  Bed Mobility Overal bed mobility: Independent                  Transfers Overall transfer level: Independent                  Ambulation/Gait Ambulation/Gait assistance: Independent Gait Distance (Feet): 250 Feet Assistive device: None Gait Pattern/deviations: WFL(Within Functional Limits)        Stairs            Wheelchair Mobility    Modified Rankin (Stroke Patients Only)       Balance Overall balance assessment: Independent                                           Pertinent Vitals/Pain Pain Assessment: Faces Faces Pain Scale: Hurts little more Pain Location: abd Pain Intervention(s): Monitored during session    Home Living Family/patient expects to be discharged to:: Private residence Living Arrangements: Spouse/significant other   Type of Home: House       Home Layout: One level Home Equipment: None      Prior Function Level of Independence: Independent               Hand Dominance        Extremity/Trunk Assessment   Upper Extremity Assessment Upper Extremity Assessment: Overall WFL for tasks assessed    Lower Extremity Assessment Lower Extremity  Assessment: Overall WFL for tasks assessed    Cervical / Trunk Assessment Cervical / Trunk Assessment: Normal  Communication   Communication: No difficulties  Cognition Arousal/Alertness: Awake/alert Behavior During Therapy: WFL for tasks assessed/performed Overall Cognitive Status: Within Functional Limits for tasks assessed                                        General Comments      Exercises     Assessment/Plan    PT Assessment Patent does not need any further PT services  PT Problem List         PT Treatment Interventions      PT Goals (Current goals can be found in the Care Plan section)  Acute Rehab PT Goals PT Goal Formulation: All assessment and education complete, DC therapy    Frequency     Barriers to discharge        Co-evaluation               AM-PAC PT "6 Clicks" Mobility  Outcome Measure Help needed turning from your back to your side while in a flat bed without using bedrails?: None  Help needed moving from lying on your back to sitting on the side of a flat bed without using bedrails?: None Help needed moving to and from a bed to a chair (including a wheelchair)?: None Help needed standing up from a chair using your arms (e.g., wheelchair or bedside chair)?: None Help needed to walk in hospital room?: None Help needed climbing 3-5 steps with a railing? : None 6 Click Score: 24    End of Session   Activity Tolerance: Patient tolerated treatment well Patient left:  (in room mobilizing independently)        Time: 2620-3559 PT Time Calculation (min) (ACUTE ONLY): 8 min   Charges:   PT Evaluation $PT Eval Low Complexity: 1 Low             Faye Ramsay, PT Acute Rehabilitation  Office: [phone] Pager: [phone]

## 2021-02-21 NOTE — Anesthesia Preprocedure Evaluation (Addendum)
Anesthesia Evaluation  Patient identified by MRN, date of birth, ID band Patient awake    Reviewed: Allergy & Precautions, NPO status , Patient's Chart, lab work & pertinent test results  History of Anesthesia Complications Negative for: history of anesthetic complications  Airway Mallampati: II  TM Distance: >3 FB Neck ROM: Full    Dental no notable dental hx. (+) Dental Advisory Given   Pulmonary neg pulmonary ROS, former smoker,    Pulmonary exam normal        Cardiovascular hypertension, Normal cardiovascular exam     Neuro/Psych PSYCHIATRIC DISORDERS Anxiety Depression negative neurological ROS     GI/Hepatic GERD  ,(+) Cirrhosis   Esophageal Varices  substance abuse  alcohol use,   Endo/Other  negative endocrine ROS  Renal/GU negative Renal ROS     Musculoskeletal negative musculoskeletal ROS (+)   Abdominal   Peds  Hematology negative hematology ROS (+)   Anesthesia Other Findings   Reproductive/Obstetrics                            Anesthesia Physical Anesthesia Plan  ASA: IV  Anesthesia Plan: MAC   Post-op Pain Management:    Induction: Intravenous  PONV Risk Score and Plan:   Airway Management Planned: Natural Airway  Additional Equipment:   Intra-op Plan:   Post-operative Plan:   Informed Consent: I have reviewed the patients History and Physical, chart, labs and discussed the procedure including the risks, benefits and alternatives for the proposed anesthesia with the patient or authorized representative who has indicated his/her understanding and acceptance.     Dental advisory given  Plan Discussed with: Anesthesiologist and CRNA  Anesthesia Plan Comments:        Anesthesia Quick Evaluation

## 2021-02-21 NOTE — Op Note (Signed)
[employer] Patient Name: Adam Hartman Procedure Date: 02/21/2021 MRN: [id number] Attending MD: Napoleon Form , MD Date of Birth: [birth year] CSN: [id number] Age: 55 Admit Type: Inpatient Procedure:                Upper GI endoscopy Indications:              To evaluate esophageal varices in patient with                            suspected portal hypertension Providers:                Napoleon Form, MD, Blenda Mounts, RN, Leanne Lovely, Technician, Mirian Mo, CRNA Referring MD:              Medicines:                Monitored Anesthesia Care Complications:            No immediate complications. Estimated Blood Loss:     Estimated blood loss was minimal. Procedure:                Pre-Anesthesia Assessment:                           - Prior to the procedure, a History and Physical                            was performed, and patient medications and                            allergies were reviewed. The patient's tolerance of                            previous anesthesia was also reviewed. The risks                            and benefits of the procedure and the sedation                            options and risks were discussed with the patient.                            All questions were answered, and informed consent                            was obtained. Prior Anticoagulants: The patient has                            taken no previous anticoagulant or antiplatelet                            agents. ASA Grade Assessment: III - A patient with  severe systemic disease. After reviewing the risks                            and benefits, the patient was deemed in                            satisfactory condition to undergo the procedure.                           After obtaining informed consent, the endoscope was                            passed under direct vision. Throughout the                             procedure, the patient's blood pressure, pulse, and                            oxygen saturations were monitored continuously. The                            [id number]-[id number] ([id number]) was introduced through the                            mouth, and advanced to the second part of duodenum.                            The upper GI endoscopy was accomplished without                            difficulty. The patient tolerated the procedure                            well. Scope In: Scope Out: Findings:      Four columns of grade II, small (< 5 mm) varices with no bleeding and no       stigmata of recent bleeding were found in the lower third of the       esophagus, 34 to 40 cm from the incisors. They were less than 5 mm in       largest diameter. No red wale signs were present.      A small amount of food (residue) was found in the gastric body. Lavage       of the area was performed, resulting in clearance with adequate       visualization.      Mild portal hypertensive gastropathy was found in the gastric fundus and       in the gastric body.      The examined duodenum was normal. Impression:               - Grade II and small (< 5 mm) esophageal varices                            with no bleeding and no stigmata of recent bleeding.                           -  A small amount of food (residue) in the stomach.                           - Portal hypertensive gastropathy.                           - Normal examined duodenum.                           - No specimens collected. Moderate Sedation:      Not Applicable - Patient had care per Anesthesia. Recommendation:           - Patient has a contact number available for                            emergencies. The signs and symptoms of potential                            delayed complications were discussed with the                            patient. Return to normal activities tomorrow.                            Written  discharge instructions were provided to the                            patient.                           - Resume previous diet.                           - Continue present medications.                           - Start Nadolol 20mg  daily, non selective beta                            blocker with dosage titrated to heart rate ~50-60.                           - Continue current dose diuretics and lactulose                           - Follow up AFP level and repeat MRI liver in 3                            months if AFP within normal range                           - Follow up CMP in 1 week on discharge                           - GI office follow up in 2-4 after discharge                           -  Ok to discharge home from GI standpoint                           - GI will sign off, please call with any questions Procedure Code(s):        --- Professional ---                           [phone], Esophagogastroduodenoscopy, flexible,                            transoral; diagnostic, including collection of                            specimen(s) by brushing or washing, when performed                            (separate procedure) Diagnosis Code(s):        --- Professional ---                           I85.00, Esophageal varices without bleeding                           K76.6, Portal hypertension                           K31.89, Other diseases of stomach and duodenum CPT copyright 2019 [employer]. All rights reserved. The codes documented in this report are preliminary and upon coder review may  be revised to meet current compliance requirements. Napoleon FormKavitha V. Merlin Golden, MD 02/21/2021 1:30:47 PM This report has been signed electronically. Number of Addenda: 0

## 2021-02-22 ENCOUNTER — Telehealth: Payer: Self-pay

## 2021-02-22 DIAGNOSIS — K769 Liver disease, unspecified: Secondary | ICD-10-CM

## 2021-02-22 DIAGNOSIS — R1084 Generalized abdominal pain: Secondary | ICD-10-CM

## 2021-02-22 DIAGNOSIS — R112 Nausea with vomiting, unspecified: Secondary | ICD-10-CM

## 2021-02-22 LAB — CULTURE, BODY FLUID W GRAM STAIN -BOTTLE: Culture: NO GROWTH

## 2021-02-22 LAB — AFP TUMOR MARKER: AFP, Serum, Tumor Marker: 3.8 ng/mL (ref 0.0–8.4)

## 2021-02-22 NOTE — Telephone Encounter (Signed)
-----   Message from Benancio Deeds, MD sent at 02/21/2021  4:00 PM EDT ----- Regarding: RE: Hospital discharge follow up [facility] thanks for taking care of him.  Lucius Wise can you help coordinate follow up lab and office appointment? Thanks  ----- Message ----- From: Napoleon Form, MD Sent: 02/21/2021   1:32 PM EDT To: Sammuel Cooper, PA-C, Meredith Pel, NP, # Subject: Hospital discharge follow up                   AFP is pending, he has ill defined lesion in liver - Follow up AFP level and repeat MRI liver in 3 months if AFP within normal range - Follow up CMP in 1 week on discharge - GI office follow up in 2-4 after discharge  Thanks VN

## 2021-02-22 NOTE — Telephone Encounter (Addendum)
Patient has been scheduled for a 3 week hospital follow up with Dr. Adela Lank on Thursday, 03/15/21 at 1:20 PM.   Lab order and reminder in epic for repeat CMET in 1 week.    Will await AFP results for further recommendation on repeat AFP and repeat MRI liver.   Called patient twice, phone rings for several minutes and then I get a fast busy signal. I was not able to leave a voicemail at this time. I have relayed information and recommendations to patient's wife. Wife verbalized understanding of all information and had no concerns at the end of the call.

## 2021-03-01 ENCOUNTER — Telehealth: Payer: Self-pay

## 2021-03-01 NOTE — Telephone Encounter (Signed)
Called patient's listed number, phone rings for several minutes and no prompt to leave a voicemail.  Lm on wife's vm for her to return call.

## 2021-03-01 NOTE — Telephone Encounter (Signed)
Spoke with patient's wife to remind her that patient is due for repeat labs at this time. No appointment is necessary. Patient's wife is aware that he can stop by the lab in the basement at his convenience either today or tomorrow. Patient's wife verbalized understanding and had no concerns at the end of the call.

## 2021-03-01 NOTE — Telephone Encounter (Signed)
-----   Message from Missy Sabins, RN sent at 02/22/2021 10:56 AM EDT ----- Regarding: Labs CMET, order in epic.

## 2021-03-02 ENCOUNTER — Other Ambulatory Visit (INDEPENDENT_AMBULATORY_CARE_PROVIDER_SITE_OTHER): Payer: Self-pay

## 2021-03-02 DIAGNOSIS — R112 Nausea with vomiting, unspecified: Secondary | ICD-10-CM

## 2021-03-02 DIAGNOSIS — R1084 Generalized abdominal pain: Secondary | ICD-10-CM

## 2021-03-02 DIAGNOSIS — K769 Liver disease, unspecified: Secondary | ICD-10-CM

## 2021-03-02 LAB — COMPREHENSIVE METABOLIC PANEL
ALT: 36 U/L (ref 0–53)
AST: 65 U/L — ABNORMAL HIGH (ref 0–37)
Albumin: 3 g/dL — ABNORMAL LOW (ref 3.5–5.2)
Alkaline Phosphatase: 106 U/L (ref 39–117)
BUN: 10 mg/dL (ref 6–23)
CO2: 27 mEq/L (ref 19–32)
Calcium: 8.4 mg/dL (ref 8.4–10.5)
Chloride: 94 mEq/L — ABNORMAL LOW (ref 96–112)
Creatinine, Ser: 0.83 mg/dL (ref 0.40–1.50)
GFR: 99.03 mL/min (ref >60.00)
Glucose, Bld: 116 mg/dL — ABNORMAL HIGH (ref 70–99)
Potassium: 4.2 mEq/L (ref 3.5–5.1)
Sodium: 130 mEq/L — ABNORMAL LOW (ref 135–145)
Total Bilirubin: 4.8 mg/dL — ABNORMAL HIGH (ref 0.2–1.2)
Total Protein: 6.9 g/dL (ref 6.0–8.3)

## 2021-03-06 ENCOUNTER — Other Ambulatory Visit: Payer: Self-pay

## 2021-03-06 DIAGNOSIS — R1084 Generalized abdominal pain: Secondary | ICD-10-CM

## 2021-03-06 DIAGNOSIS — R112 Nausea with vomiting, unspecified: Secondary | ICD-10-CM

## 2021-03-06 DIAGNOSIS — K769 Liver disease, unspecified: Secondary | ICD-10-CM

## 2021-03-13 ENCOUNTER — Other Ambulatory Visit: Payer: Self-pay

## 2021-03-13 ENCOUNTER — Other Ambulatory Visit (HOSPITAL_COMMUNITY)
Admission: RE | Admit: 2021-03-13 | Discharge: 2021-03-13 | Disposition: A | Discharge status: Home / Self Care | Payer: Self-pay | Source: Home / Self Care | Attending: Gastroenterology | Admitting: Gastroenterology

## 2021-03-13 ENCOUNTER — Other Ambulatory Visit (HOSPITAL_COMMUNITY)
Admission: RE | Admit: 2021-03-13 | Discharge: 2021-03-13 | Disposition: A | Discharge status: Home / Self Care | Payer: Self-pay | Source: Ambulatory Visit | Attending: Gastroenterology | Admitting: Gastroenterology

## 2021-03-13 ENCOUNTER — Telehealth: Payer: Self-pay

## 2021-03-13 DIAGNOSIS — R112 Nausea with vomiting, unspecified: Secondary | ICD-10-CM | POA: Insufficient documentation

## 2021-03-13 DIAGNOSIS — R1084 Generalized abdominal pain: Secondary | ICD-10-CM

## 2021-03-13 DIAGNOSIS — K769 Liver disease, unspecified: Secondary | ICD-10-CM

## 2021-03-13 LAB — COMPREHENSIVE METABOLIC PANEL
ALT: 35 U/L (ref 0–44)
AST: 65 U/L — ABNORMAL HIGH (ref 15–41)
Albumin: 3.2 g/dL — ABNORMAL LOW (ref 3.5–5.0)
Alkaline Phosphatase: 110 U/L (ref 38–126)
Anion gap: 8 (ref 5–15)
BUN: 9 mg/dL (ref 6–20)
CO2: 26 mmol/L (ref 22–32)
Calcium: 8.5 mg/dL — ABNORMAL LOW (ref 8.9–10.3)
Chloride: 95 mmol/L — ABNORMAL LOW (ref 98–111)
Creatinine, Ser: 0.84 mg/dL (ref 0.61–1.24)
GFR, Estimated: >60 mL/min (ref >60)
Glucose, Bld: 120 mg/dL — ABNORMAL HIGH (ref 70–99)
Potassium: 4.2 mmol/L (ref 3.5–5.1)
Sodium: 129 mmol/L — ABNORMAL LOW (ref 135–145)
Total Bilirubin: 4.8 mg/dL — ABNORMAL HIGH (ref 0.3–1.2)
Total Protein: 7.7 g/dL (ref 6.5–8.1)

## 2021-03-13 NOTE — Telephone Encounter (Signed)
-----   Message from Missy Sabins, RN sent at 03/06/2021  4:39 PM EDT ----- Regarding: Labs Repeat CMET, order in epic.

## 2021-03-13 NOTE — Telephone Encounter (Signed)
Spoke with patient's wife to remind her that patient is due for repeat labs at this time. No appointment is necessary. Wife states that she checked with [facility] and they will be able to draw patient's labs. Wife states that patient's mother will take him today for labs. Patient's wife verbalized understanding and had no concerns at the end of the call.

## 2021-03-15 ENCOUNTER — Encounter: Payer: Self-pay | Admitting: Gastroenterology

## 2021-03-15 ENCOUNTER — Ambulatory Visit: Payer: Self-pay | Admitting: Gastroenterology

## 2021-03-15 VITALS — BP 126/70 | HR 65 | Ht 71.0 in | Wt 246.0 lb

## 2021-03-15 DIAGNOSIS — Z23 Encounter for immunization: Secondary | ICD-10-CM

## 2021-03-15 DIAGNOSIS — K7682 Hepatic encephalopathy: Secondary | ICD-10-CM

## 2021-03-15 DIAGNOSIS — R932 Abnormal findings on diagnostic imaging of liver and biliary tract: Secondary | ICD-10-CM

## 2021-03-15 DIAGNOSIS — R609 Edema, unspecified: Secondary | ICD-10-CM

## 2021-03-15 DIAGNOSIS — K7031 Alcoholic cirrhosis of liver with ascites: Secondary | ICD-10-CM

## 2021-03-15 DIAGNOSIS — R066 Hiccough: Secondary | ICD-10-CM

## 2021-03-15 DIAGNOSIS — E871 Hypo-osmolality and hyponatremia: Secondary | ICD-10-CM

## 2021-03-15 DIAGNOSIS — I8500 Esophageal varices without bleeding: Secondary | ICD-10-CM

## 2021-03-15 DIAGNOSIS — K7290 Hepatic failure, unspecified without coma: Secondary | ICD-10-CM

## 2021-03-15 MED ORDER — GABAPENTIN 300 MG PO CAPS: 300.0000 mg | ORAL_CAPSULE | Freq: Two times a day (BID) | ORAL | 0 refills | Status: DC | PRN

## 2021-03-15 MED ORDER — LACTULOSE 10 GM/15ML PO SOLN: 20.0000 g | Freq: Three times a day (TID) | ORAL | 2 refills | Status: DC

## 2021-03-15 MED ORDER — FUROSEMIDE 40 MG PO TABS: 40.0000 mg | ORAL_TABLET | Freq: Every day | ORAL | 0 refills | Status: DC

## 2021-03-15 MED ORDER — SPIRONOLACTONE 100 MG PO TABS: 100.0000 mg | ORAL_TABLET | Freq: Every day | ORAL | 0 refills | Status: DC

## 2021-03-15 MED ORDER — PANTOPRAZOLE SODIUM 40 MG PO TBEC: 40.0000 mg | DELAYED_RELEASE_TABLET | Freq: Every day | ORAL | 1 refills | Status: DC

## 2021-03-15 NOTE — Progress Notes (Signed)
HPI :  55 year old male here for a follow-up visit for decompensated cirrhosis.  This is my first time meeting this patient. He was first seen by Mike Gip in our office May 5 for new patient consult for cirrhosis and abdominal pain.  He was writhing in abdominal pain at that time and was referred to the hospital where he was subsequently admitted for further evaluation.  He was found to be jaundiced with elevated liver enzymes, ascites, and severe abdominal pain.  He had a CT scan, MRI liver, upper endoscopy, lab work-up, paracentesis as outlined below.  He did not have SBP.  He was started on Lasix and Aldactone for treatment of his ascites and lower extremity edema.  He was started on low-dose nadolol for small varices noted on EGD.  He did have an MRI showing cirrhosis of the liver with a small lesion in the liver that was indeterminant.  AFP was negative.  It was recommended he have a follow-up MRI in 3 months.  I reviewed his course with him and his prior history.  He previously has drank alcohol very heavily, upwards of 24 beers per day plus vodka for the past several years.  Wife present with him today, he stopped drinking completely on April 4 and has not had any further alcohol use.  He also stopped smoking cigarettes in February.  He has had ascites and lower extremity edema, mild hepatic encephalopathy, and jaundice in relation to his cirrhosis.  We have been monitoring his labs, he had a serologic work-up for other causes of liver disease which was negative.  He has had a slight rise in his bilirubin from threes to fours with mild AST over ALT elevation.  He has been taking Lasix 40 mg a day as well as Aldactone 100 mg a day.  Wife states he is urinating okay.  He is following a low-sodium diet as best he can.  He has had baseline constipation, taking lactulose twice daily and remains a bit constipated despite that.  No blood in his stools.  His father had colon cancer diagnosed age 10.   The patient states he had a colonoscopy 4 years ago and had a few polyps removed, done at [facility].  Patient had significant abdominal pain of unclear etiology while hospitalized.  He states this is significantly proved since his discharge.  His abdomen has been improved with the diuretics and he has much less ascites although not completely gone.  He also has improvement in lower extremity edema but not gone.  He has been using OxyContin 10 mg twice daily for abdominal pain, as well as Ativan as needed for anxiety.  They asked about refills for that or if he should continue that.  He had also been using gabapentin for hiccups which started during his hospitalization and has improved with gabapentin but when he stops it his symptoms seem to recur.  He is not immune to hepatitis A or B.  CT abdomen pelvis scan 02/15/21: IMPRESSION: Hepatic cirrhosis, and findings of portal venous hypertension including esophageal varices and splenomegaly. Mild-to-moderate ascites. 2 cm ill-defined low-attenuation lesion in posterior right hepatic lobe. Neoplasm cannot be excluded. Abdomen MRI without and with contrast recommended for further characterization. Aortic Atherosclerosis (ICD10-I70.0).   MRI liver 02/16/21: IMPRESSION: 1. The previously demonstrated lesion of concern inferiorly in the right hepatic lobe has nonspecific MR features with delayed ring enhancement and ill-defined T2 signal. These are not the typical features of hepatocellular carcinoma, although non-HCC  malignancy such as intrahepatic cholangiocarcinoma or metastatic disease cannot be excluded. Recommend MR follow-up in 3 months. This lesion is likely too small to evaluate or biopsy by ultrasound. 2. Morphologic changes of cirrhosis with secondary signs of portal hypertension. Mild-to-moderate ascites. 3. Prominent lymph nodes in the porta hepatis, likely reactive.   EGD 02/21/21:Grade II and small (< 5 mm) esophageal varices with  no bleeding and no stigmata of recent bleeding. - A small amount of food (residue) in the stomach. - Portal hypertensive gastropathy. - Normal examined duodenum. - No specimens collected.   Past Medical History:  Diagnosis Date  . Alcoholism (HCC)   . Anxiety   . Ascites   . Cirrhosis (HCC)   . Colon polyps   . Depression   . GERD (gastroesophageal reflux disease)   . Hypertension      Past Surgical History:  Procedure Laterality Date  . ESOPHAGOGASTRODUODENOSCOPY (EGD) WITH PROPOFOL N/A 02/21/2021   Procedure: ESOPHAGOGASTRODUODENOSCOPY (EGD) WITH PROPOFOL;  Surgeon: Napoleon Form, MD;  Location: [facility];  Service: Endoscopy;  Laterality: N/A;  . WISDOM TOOTH EXTRACTION     Family History  Problem Relation Age of Onset  . Skin cancer Mother   . Peripheral vascular disease Father   . Stomach cancer Father   . Congenital heart disease Maternal Grandmother   . Heart attack Maternal Grandmother   . CAD Maternal Grandmother   . Dementia Maternal Grandfather   . CAD Maternal Grandfather   . Heart attack Maternal Grandfather   . Skin cancer Maternal Grandfather   . Congenital heart disease Paternal Grandmother   . Lung cancer Paternal Grandmother   . Cancer Paternal Grandfather   . COPD Paternal Grandfather   . Colon cancer Neg Hx   . Colon polyps Neg Hx    Social History   Tobacco Use  . Smoking status: Former Smoker    Packs/day: 3.00    Years: 15.00    Pack years: 45.00    Types: Cigarettes    Quit date: 11/24/2020    Years since quitting: 0.3  . Smokeless tobacco: Never Used  Vaping Use  . Vaping Use: Never used  Substance Use Topics  . Alcohol use: Not Currently    Alcohol/week: 18.0 - 19.0 standard drinks    Types: 18 - 19 Cans of beer per week    Comment: 01/15/2021  . Drug use: No   Current Outpatient Medications  Medication Sig Dispense Refill  . folic acid (FOLVITE) 1 MG tablet Take 1 tablet (1 mg total) by mouth daily.    . furosemide  (LASIX) 40 MG tablet Take 1 tablet (40 mg total) by mouth daily. 30 tablet 1  . lactulose (CHRONULAC) 10 GM/15ML solution Take 30 mLs (20 g total) by mouth 2 (two) times daily. 1892 mL 1  . LORazepam (ATIVAN) 0.5 MG tablet Take 0.5 mg by mouth every 8 (eight) hours as needed for anxiety.    . multivitamin (PROSIGHT) TABS tablet Take 1 tablet by mouth daily. 30 tablet 0  . nadolol (CORGARD) 20 MG tablet Take 1 tablet (20 mg total) by mouth daily. 30 tablet 1  . oxyCODONE (OXYCONTIN) 10 mg 12 hr tablet Take 1 tablet (10 mg total) by mouth every 12 (twelve) hours. 10 tablet 0  . pantoprazole (PROTONIX) 40 MG tablet Take 1 tablet (40 mg total) by mouth daily. 30 tablet 1  . spironolactone (ALDACTONE) 100 MG tablet Take 1 tablet (100 mg total) by mouth daily. 30 tablet  1  . thiamine 100 MG tablet Take 1 tablet (100 mg total) by mouth daily.     No current facility-administered medications for this visit.   Allergies  Allergen Reactions  . Atenolol   . Codeine Itching  . Wellbutrin [Bupropion]      Review of Systems: All systems reviewed and negative except where noted in HPI.    CT ABDOMEN PELVIS W CONTRAST  Result Date: 02/15/2021 CLINICAL DATA:  Severe abdominal pain and distention. Alcoholic cirrhosis. EXAM: CT ABDOMEN AND PELVIS WITH CONTRAST TECHNIQUE: Multidetector CT imaging of the abdomen and pelvis was performed using the standard protocol following bolus administration of intravenous contrast. CONTRAST:  OMNIPAQUE IOHEXOL 300 MG/ML  SOLN COMPARISON:  None. FINDINGS: Lower Chest: No acute findings. Hepatobiliary: Hepatic cirrhosis is demonstrated. An ill-defined low-attenuation lesion is seen in the posterior right hepatic lobe measuring approximately 2 cm on image 43/2. A tiny cyst is seen in the anterior liver dome. Recanalization of paraumbilical veins is consistent with portal venous hypertension. Gallbladder is unremarkable. No evidence of biliary ductal dilatation. Pancreas:   No mass or inflammatory changes. Spleen: Moderate splenomegaly with length measuring 15-16 cm. No masses identified. Adrenals/Urinary Tract: No masses identified. No evidence of ureteral calculi or hydronephrosis. Stomach/Bowel: No evidence of obstruction, inflammatory process or abnormal fluid collections. Vascular/Lymphatic: No pathologically enlarged lymph nodes. No acute vascular findings. Aortic atherosclerotic calcification noted. Abdominal portosystemic venous collaterals are seen within the abdomen including esophageal varices, consistent with portal venous hypertension. Reproductive:  No mass or other significant abnormality. Other:  Mild-to-moderate ascites seen within the abdomen pelvis. Musculoskeletal:  No suspicious bone lesions identified. IMPRESSION: Hepatic cirrhosis, and findings of portal venous hypertension including esophageal varices and splenomegaly. Mild-to-moderate ascites. 2 cm ill-defined low-attenuation lesion in posterior right hepatic lobe. Neoplasm cannot be excluded. Abdomen MRI without and with contrast recommended for further characterization. Aortic Atherosclerosis (ICD10-I70.0). Electronically Signed   By: Danae Orleans M.D.   On: 02/15/2021 18:21   MR LIVER W WO CONTRAST  Result Date: 02/16/2021 CLINICAL DATA:  Abdominal pain. History of cirrhosis and portal hypertension. Liver lesion on CT EXAM: MRI ABDOMEN WITHOUT AND WITH CONTRAST TECHNIQUE: Multiplanar multisequence MR imaging of the abdomen was performed both before and after the administration of intravenous contrast. CONTRAST:  71mL GADAVIST GADOBUTROL 1 MMOL/ML IV SOLN COMPARISON:  Abdominopelvic CT 02/15/2021. Abdominal ultrasound 01/19/2021. FINDINGS: Image quality is mildly degraded by motion artifact. Lower chest: The visualized lower chest demonstrates no significant findings. Hepatobiliary: Mild hepatomegaly with the liver measuring up to 24 cm in height. There are mild morphologic changes of cirrhosis. There is a  simple cyst anteriorly in the dome of the right hepatic lobe which measures 1.4 cm on image 6/11. There are additional tiny cysts. The lesion of concern inferiorly in the right hepatic lobe corresponds with an ill-defined area of mildly increased T2 signal measuring 1.8 cm on image 40/11. No arterial phase enhancement of this lesion is seen, although there is delayed ring enhancement with a small central area of nonenhancement measuring 7 mm on image 71/25. No other focal liver lesions are identified. No evidence of gallstones, gallbladder wall thickening or biliary dilatation. Pancreas: Unremarkable. No pancreatic ductal dilatation or surrounding inflammatory changes. Spleen: Mild to moderate splenomegaly without focal abnormality. Adrenals/Urinary Tract: Both adrenal glands appear normal. No suspicious renal findings. No evidence of renal mass or hydronephrosis. Stomach/Bowel: The stomach appears unremarkable for its degree of distension. No evidence of bowel wall thickening, distention or surrounding inflammatory  change. Vascular/Lymphatic: Several prominent lymph nodes in the porta hepatis are similar to recent CT and likely reactive given the patient's cirrhosis. No retroperitoneal lymphadenopathy. No acute vascular findings identified. Gastric and distal esophageal varices again noted consistent with portal hypertension. The portal, superior mesenteric and splenic veins are patent. Other: Mild-to-moderate ascites. Musculoskeletal: No acute or significant osseous findings. IMPRESSION: 1. The previously demonstrated lesion of concern inferiorly in the right hepatic lobe has nonspecific MR features with delayed ring enhancement and ill-defined T2 signal. These are not the typical features of hepatocellular carcinoma, although non-HCC malignancy such as intrahepatic cholangiocarcinoma or metastatic disease cannot be excluded. Recommend MR follow-up in 3 months. This lesion is likely too small to evaluate or biopsy  by ultrasound. 2. Morphologic changes of cirrhosis with secondary signs of portal hypertension. Mild-to-moderate ascites. 3. Prominent lymph nodes in the porta hepatis, likely reactive. Electronically Signed   By: Carey BullocksWilliam  Veazey M.D.   On: 02/16/2021 19:58   US Paracentesis  Result Date: 02/17/2021 INDICATION: Ascites EXAM: ULTRASOUND GUIDED LLQ PARACENTESIS MEDICATIONS: 10 cc 1% lidocaine. COMPLICATIONS: None immediate. PROCEDURE: Informed written consent was obtained from the patient after a discussion of the risks, benefits and alternatives to treatment. A timeout was performed prior to the initiation of the procedure. Initial ultrasound scanning demonstrates a large amount of ascites within the left lower abdominal quadrant. The left lower abdomen was prepped and draped in the usual sterile fashion. 1% lidocaine was used for local anesthesia. Following this, a Yueh catheter was introduced. An ultrasound image was saved for documentation purposes. The paracentesis was performed. The catheter was removed and a dressing was applied. The patient tolerated the procedure well without immediate post procedural complication. Patient received post-procedure intravenous albumin; see nursing notes for details. FINDINGS: A total of approximately 2.1 liters of dark yellow fluid was removed. Samples were sent to the laboratory as requested by the clinical team. IMPRESSION: Successful ultrasound-guided paracentesis yielding 2.1 liters of peritoneal fluid. Read by Robet LeuPamela A [facility] Electronically Signed   By: Malachy MoanHeath  McCullough M.D.   On: 02/17/2021 14:47   Lab Results  Component Value Date   WBC 10.4 02/21/2021   HGB 13.9 02/21/2021   HCT 41.5 02/21/2021   MCV 101.5 (H) 02/21/2021   PLT 137 (L) 02/21/2021    Lab Results  Component Value Date   CREATININE 0.84 03/13/2021   BUN 9 03/13/2021   NA 129 (L) 03/13/2021   K 4.2 03/13/2021   CL 95 (L) 03/13/2021   CO2 26 03/13/2021    Lab Results  Component  Value Date   ALT 35 03/13/2021   AST 65 (H) 03/13/2021   ALKPHOS 110 03/13/2021   BILITOT 4.8 (H) 03/13/2021     Physical Exam: BP 126/70   Pulse 65   Ht 5\' 11"  (1.803 m)   Wt 246 lb (111.6 kg)   BMI 34.31 kg/m  Constitutional: Pleasant, jaundiced male in no acute distress. Abdominal: Soft, mildly distended, nontender. There are no masses palpable.  Extremities: (+) trace to 1 () edema Neurological: Alert and oriented to person place and time. Skin: Skin is warm and dry. No rashes noted. Psychiatric: Normal mood and affect. Behavior is normal.   ASSESSMENT AND PLAN: 55 year old male here for reassessment of the following:  Alcoholic cirrhosis with ascites Hepatic encephalopathy Esophageal varices Hyponatremia Abnormal liver MRI LE edema Hiccups  Discussed the patient's course with him as above.  He has alcoholic cirrhosis that is decompensated, perhaps with a component of  alcoholic hepatitis on top of that causing recent hospitalization.  No clear cause of abdominal pain was found, that has since improved, perhaps due to component of alcoholic hepatitis or tight ascites.  We discussed cirrhosis, risks for worsening decompensation and liver failure, risks for HCC.  He understands there is a nodule on his liver that is indeterminate and will warrant a close follow-up liver MRI in 3 months, currently not amenable to biopsy, AFP is normal.  Being abstinent from alcohol is of utmost importance and he understands the risks of continued alcohol use.  He has been abstinent since April and hopefully can continue maintaining that.  Hopefully with time away from alcohol and management of his decompensations he can improve.  He understands that if he does not and worsens, I will refer him to hepatology for transplant evaluation however he understands he needs to abstain from alcohol for 6 months to be considered a candidate in most circumstances.  In addition to avoiding alcohol, he will  continue his current dosing of Lasix and Aldactone.  He will follow a low-sodium diet.  I am concerned about his sodium slightly downtrending and we will repeat his labs next week to make sure stable.  Given his problems with ascites and lower extremity edema, risks of nadolol may outweigh benefits at this time and we will stop the nadolol until his volume status is under better control.  In regards to his hepatic encephalopathy, I do think he has a mild component of that on interaction today, we will increase his lactulose to 3 times daily.  Further, we discussed his narcotic and Ativan use, long-term I would recommend he completely avoid these in light of his liver disease and risks for worsening hepatic encephalopathy.  He is through alcohol detox/withdrawal, he does not need benzodiazepines, would manage his anxiety with other regimens, he will talk with his PCP about that.  He should wean off his narcotics as well.  If he needs these to function for short course, he will get this from his primary care physician, we discussed he should only have 1 person prescribing these.  I will refill his gabapentin for short course for hiccups, hopefully this improves with time.  He agrees with the plan as outlined, we will reassess his labs next week, I will keep a close eye on him and see him again in 1 month for reassessment.  We will need to get his prior colonoscopy report to confirm findings and clarify when he is next due, and make sure we schedule MRI for August of this year with [facility].  Plan: - maintain complete alcohol abstinence - continue Lasix and Aldactone as prescribed - we will repeat CMET next week - continue low-sodium diet - stop nadolol for now given problems with ascites and lower extremity edema, hopefully can resume in upcoming weeks once volume status is better - recommend he come off Ativan and OxyContin, this is not a good regimen for him to be on with his liver disease and encephalopathy.   He will talk with his primary care if he needs a short-term refill for this - refilled gabapentin to use as needed for hiccups, hopefully this improves with time - MRI with [facility] contrast to be done in August - follow-up in the office with me in 1 month  They will call sooner with any problems or issues in the interim  Ileene Patrick, MD [facility]

## 2021-03-15 NOTE — Patient Instructions (Addendum)
If you are age 55 or older, your body mass index should be between 23-30. Your Body mass index is 34.31 kg/m. If this is out of the aforementioned range listed, please consider follow up with your Primary Care Provider.  If you are age 57 or younger, your body mass index should be between 19-25. Your Body mass index is 34.31 kg/m. If this is out of the aformentioned range listed, please consider follow up with your Primary Care Provider.   __________________________________________________________  The  GI providers would like to encourage you to use [facility] to communicate with providers for non-urgent requests or questions.  Due to long hold times on the telephone, sending your provider a message by [facility] may be a faster and more efficient way to get a response.  Please allow 48 business hours for a response.  Please remember that this is for non-urgent requests.  _________________________________________________________  [name] will be contacted by [facility] Sched[facility] in the next 7 days to arrange a MRI of Liver in August.  The number on your caller ID will be [phone], please answer when they call.  If you have not heard from them in 7 days please call [phone] to schedule.     Please go to the lab at [facility] next week for CMET.   Discontinue Nadolol   We have sent the following medications to your pharmacy for you to pick up at your convenience: Lactulose: Increase to three times daily Lasix 40 mg: Take once a day Aldactone 100 mg: Take once a day Protonix 40 mg: Take once a day Gabapentin 300 mg: Take twice a day as needed for hiccups   You have a nurse visit on Tuesday, 04-17-21 at 9:45am for your 2nd Twinrix vaccine.    We have scheduled you for a follow up appointment with Dr. Adela Lank on Tuesday, 7-26 at 3:00pm.   We are giving you a low sodium diet to follow.   Thank you for entrusting me with your care and for choosing [facility], Dr. Ileene Patrick

## 2021-03-22 ENCOUNTER — Other Ambulatory Visit (HOSPITAL_COMMUNITY)
Admission: RE | Admit: 2021-03-22 | Discharge: 2021-03-22 | Disposition: A | Discharge status: Home / Self Care | Payer: Self-pay | Source: Ambulatory Visit | Attending: Gastroenterology | Admitting: Gastroenterology

## 2021-03-22 DIAGNOSIS — K7031 Alcoholic cirrhosis of liver with ascites: Secondary | ICD-10-CM

## 2021-03-22 DIAGNOSIS — K7682 Hepatic encephalopathy: Secondary | ICD-10-CM

## 2021-03-22 DIAGNOSIS — R066 Hiccough: Secondary | ICD-10-CM

## 2021-03-22 DIAGNOSIS — K7290 Hepatic failure, unspecified without coma: Secondary | ICD-10-CM | POA: Insufficient documentation

## 2021-03-22 DIAGNOSIS — R932 Abnormal findings on diagnostic imaging of liver and biliary tract: Secondary | ICD-10-CM

## 2021-03-22 DIAGNOSIS — R609 Edema, unspecified: Secondary | ICD-10-CM

## 2021-03-22 DIAGNOSIS — I8500 Esophageal varices without bleeding: Secondary | ICD-10-CM

## 2021-03-22 LAB — COMPREHENSIVE METABOLIC PANEL
ALT: 52 U/L — ABNORMAL HIGH (ref 0–44)
AST: 97 U/L — ABNORMAL HIGH (ref 15–41)
Albumin: 3.3 g/dL — ABNORMAL LOW (ref 3.5–5.0)
Alkaline Phosphatase: 144 U/L — ABNORMAL HIGH (ref 38–126)
Anion gap: 8 (ref 5–15)
BUN: 8 mg/dL (ref 6–20)
CO2: 25 mmol/L (ref 22–32)
Calcium: 9.4 mg/dL (ref 8.9–10.3)
Chloride: 99 mmol/L (ref 98–111)
Creatinine, Ser: 0.73 mg/dL (ref 0.61–1.24)
GFR, Estimated: >60 mL/min (ref >60)
Glucose, Bld: 125 mg/dL — ABNORMAL HIGH (ref 70–99)
Potassium: 4.4 mmol/L (ref 3.5–5.1)
Sodium: 132 mmol/L — ABNORMAL LOW (ref 135–145)
Total Bilirubin: 4.3 mg/dL — ABNORMAL HIGH (ref 0.3–1.2)
Total Protein: 7.7 g/dL (ref 6.5–8.1)

## 2021-03-23 ENCOUNTER — Other Ambulatory Visit: Payer: Self-pay

## 2021-03-23 DIAGNOSIS — K7682 Hepatic encephalopathy: Secondary | ICD-10-CM

## 2021-03-23 DIAGNOSIS — R932 Abnormal findings on diagnostic imaging of liver and biliary tract: Secondary | ICD-10-CM

## 2021-03-23 DIAGNOSIS — E871 Hypo-osmolality and hyponatremia: Secondary | ICD-10-CM

## 2021-03-23 DIAGNOSIS — K7031 Alcoholic cirrhosis of liver with ascites: Secondary | ICD-10-CM

## 2021-03-23 DIAGNOSIS — I8500 Esophageal varices without bleeding: Secondary | ICD-10-CM

## 2021-03-23 DIAGNOSIS — R609 Edema, unspecified: Secondary | ICD-10-CM

## 2021-04-05 ENCOUNTER — Other Ambulatory Visit: Payer: Self-pay

## 2021-04-05 ENCOUNTER — Other Ambulatory Visit (HOSPITAL_COMMUNITY)
Admission: RE | Admit: 2021-04-05 | Discharge: 2021-04-05 | Disposition: A | Discharge status: Home / Self Care | Payer: Self-pay | Source: Ambulatory Visit | Attending: Gastroenterology | Admitting: Gastroenterology

## 2021-04-05 DIAGNOSIS — R932 Abnormal findings on diagnostic imaging of liver and biliary tract: Secondary | ICD-10-CM | POA: Insufficient documentation

## 2021-04-05 DIAGNOSIS — K7290 Hepatic failure, unspecified without coma: Secondary | ICD-10-CM | POA: Insufficient documentation

## 2021-04-05 DIAGNOSIS — E871 Hypo-osmolality and hyponatremia: Secondary | ICD-10-CM | POA: Insufficient documentation

## 2021-04-05 DIAGNOSIS — R609 Edema, unspecified: Secondary | ICD-10-CM | POA: Insufficient documentation

## 2021-04-05 DIAGNOSIS — K7682 Hepatic encephalopathy: Secondary | ICD-10-CM

## 2021-04-05 DIAGNOSIS — I8500 Esophageal varices without bleeding: Secondary | ICD-10-CM | POA: Insufficient documentation

## 2021-04-05 DIAGNOSIS — K7031 Alcoholic cirrhosis of liver with ascites: Secondary | ICD-10-CM | POA: Insufficient documentation

## 2021-04-05 LAB — COMPREHENSIVE METABOLIC PANEL
ALT: 59 U/L — ABNORMAL HIGH (ref 0–44)
AST: 108 U/L — ABNORMAL HIGH (ref 15–41)
Albumin: 3.3 g/dL — ABNORMAL LOW (ref 3.5–5.0)
Alkaline Phosphatase: 124 U/L (ref 38–126)
Anion gap: 7 (ref 5–15)
BUN: 8 mg/dL (ref 6–20)
CO2: 25 mmol/L (ref 22–32)
Calcium: 9.3 mg/dL (ref 8.9–10.3)
Chloride: 100 mmol/L (ref 98–111)
Creatinine, Ser: 0.71 mg/dL (ref 0.61–1.24)
GFR, Estimated: >60 mL/min (ref >60)
Glucose, Bld: 100 mg/dL — ABNORMAL HIGH (ref 70–99)
Potassium: 4.3 mmol/L (ref 3.5–5.1)
Sodium: 132 mmol/L — ABNORMAL LOW (ref 135–145)
Total Bilirubin: 3 mg/dL — ABNORMAL HIGH (ref 0.3–1.2)
Total Protein: 7.1 g/dL (ref 6.5–8.1)

## 2021-04-06 ENCOUNTER — Other Ambulatory Visit: Payer: Self-pay

## 2021-04-06 DIAGNOSIS — R609 Edema, unspecified: Secondary | ICD-10-CM

## 2021-04-06 DIAGNOSIS — K7290 Hepatic failure, unspecified without coma: Secondary | ICD-10-CM

## 2021-04-06 DIAGNOSIS — K7031 Alcoholic cirrhosis of liver with ascites: Secondary | ICD-10-CM

## 2021-04-06 DIAGNOSIS — I8500 Esophageal varices without bleeding: Secondary | ICD-10-CM

## 2021-04-06 DIAGNOSIS — K7682 Hepatic encephalopathy: Secondary | ICD-10-CM

## 2021-04-17 ENCOUNTER — Ambulatory Visit (INDEPENDENT_AMBULATORY_CARE_PROVIDER_SITE_OTHER): Payer: Self-pay | Admitting: Gastroenterology

## 2021-04-17 DIAGNOSIS — K7290 Hepatic failure, unspecified without coma: Secondary | ICD-10-CM

## 2021-04-17 DIAGNOSIS — K7460 Unspecified cirrhosis of liver: Secondary | ICD-10-CM

## 2021-04-17 DIAGNOSIS — Z23 Encounter for immunization: Secondary | ICD-10-CM

## 2021-04-18 NOTE — Progress Notes (Signed)
Vaccine given per Retia Passe

## 2021-04-23 ENCOUNTER — Telehealth: Payer: Self-pay

## 2021-04-23 ENCOUNTER — Other Ambulatory Visit: Payer: Self-pay | Admitting: Gastroenterology

## 2021-04-23 NOTE — Telephone Encounter (Signed)
-----   Message from Missy Sabins, RN sent at 04/06/2021 12:55 PM EDT ----- Regarding: Labs Repeat CMET, order in epic. Call patient's wife.

## 2021-04-23 NOTE — Telephone Encounter (Signed)
Spoke with patient's wife to reminder her that patient is due for repeat labs at this time. No appointment is necessary. Patient's wife is aware that patient can stop by the lab in the basement at his convenience between 7:30 AM - 5 PM, Monday through Friday. Adam Hartman verbalized understanding and had no concerns at the end of the call.

## 2021-04-26 ENCOUNTER — Other Ambulatory Visit (HOSPITAL_COMMUNITY)
Admission: RE | Admit: 2021-04-26 | Discharge: 2021-04-26 | Disposition: A | Discharge status: Home / Self Care | Payer: Self-pay | Source: Ambulatory Visit | Attending: Gastroenterology | Admitting: Gastroenterology

## 2021-04-26 DIAGNOSIS — K7031 Alcoholic cirrhosis of liver with ascites: Secondary | ICD-10-CM | POA: Insufficient documentation

## 2021-04-26 DIAGNOSIS — I8500 Esophageal varices without bleeding: Secondary | ICD-10-CM | POA: Insufficient documentation

## 2021-04-26 DIAGNOSIS — K7290 Hepatic failure, unspecified without coma: Secondary | ICD-10-CM | POA: Insufficient documentation

## 2021-04-26 DIAGNOSIS — K7682 Hepatic encephalopathy: Secondary | ICD-10-CM

## 2021-04-26 DIAGNOSIS — R609 Edema, unspecified: Secondary | ICD-10-CM | POA: Insufficient documentation

## 2021-04-26 LAB — COMPREHENSIVE METABOLIC PANEL
ALT: 34 U/L (ref 0–44)
AST: 63 U/L — ABNORMAL HIGH (ref 15–41)
Albumin: 3.2 g/dL — ABNORMAL LOW (ref 3.5–5.0)
Alkaline Phosphatase: 114 U/L (ref 38–126)
Anion gap: 5 (ref 5–15)
BUN: 14 mg/dL (ref 6–20)
CO2: 27 mmol/L (ref 22–32)
Calcium: 9 mg/dL (ref 8.9–10.3)
Chloride: 106 mmol/L (ref 98–111)
Creatinine, Ser: 0.97 mg/dL (ref 0.61–1.24)
GFR, Estimated: >60 mL/min (ref >60)
Glucose, Bld: 119 mg/dL — ABNORMAL HIGH (ref 70–99)
Potassium: 4.2 mmol/L (ref 3.5–5.1)
Sodium: 138 mmol/L (ref 135–145)
Total Bilirubin: 2.1 mg/dL — ABNORMAL HIGH (ref 0.3–1.2)
Total Protein: 6.6 g/dL (ref 6.5–8.1)

## 2021-05-08 ENCOUNTER — Ambulatory Visit (INDEPENDENT_AMBULATORY_CARE_PROVIDER_SITE_OTHER): Payer: Self-pay | Admitting: Gastroenterology

## 2021-05-08 ENCOUNTER — Encounter: Payer: Self-pay | Admitting: Gastroenterology

## 2021-05-08 VITALS — BP 116/62 | HR 76 | Ht 70.0 in | Wt 253.5 lb

## 2021-05-08 DIAGNOSIS — R932 Abnormal findings on diagnostic imaging of liver and biliary tract: Secondary | ICD-10-CM

## 2021-05-08 DIAGNOSIS — K7031 Alcoholic cirrhosis of liver with ascites: Secondary | ICD-10-CM

## 2021-05-08 DIAGNOSIS — I8510 Secondary esophageal varices without bleeding: Secondary | ICD-10-CM

## 2021-05-08 DIAGNOSIS — K7290 Hepatic failure, unspecified without coma: Secondary | ICD-10-CM

## 2021-05-08 DIAGNOSIS — K7682 Hepatic encephalopathy: Secondary | ICD-10-CM

## 2021-05-08 DIAGNOSIS — I8500 Esophageal varices without bleeding: Secondary | ICD-10-CM

## 2021-05-08 MED ORDER — LACTULOSE 10 GM/15ML PO SOLN: 20.0000 g | Freq: Two times a day (BID) | ORAL | 2 refills | Status: DC

## 2021-05-08 NOTE — Patient Instructions (Addendum)
If you are age 55 or older, your body mass index should be between 23-30. Your Body mass index is 36.37 kg/m. If this is out of the aforementioned range listed, please consider follow up with your Primary Care Provider.  If you are age 42 or younger, your body mass index should be between 19-25. Your Body mass index is 36.37 kg/m. If this is out of the aformentioned range listed, please consider follow up with your Primary Care Provider.   __________________________________________________________  The [facility] providers would like to encourage you to use [facility] to communicate with providers for non-urgent requests or questions.  Due to long hold times on the telephone, sending your provider a message by [facility] may be a faster and more efficient way to get a response.  Please allow 48 business hours for a response.  Please remember that this is for non-urgent requests.   Continue present medications  Please go for labs at [facility] in the next week at your convenience  Please go to your already scheduled MRI  Decrease lactulose to twice daily.  Please follow up with Dr. Adela Lank on Wednesday, 10-26 at 1:20 pm in the office.  We would like to get your records from your last colonoscopy in 2018.  Would you please contact Jan to let us know who preformed that procedure and where so that we can request those records.  Or, If you have a copy of that report we would like to make a copy for our records.  Thank you  Thank you for entrusting me with your care and for choosing [facility], Dr. Ileene Patrick

## 2021-05-08 NOTE — Progress Notes (Signed)
HPI :  55 year old male here for a follow-up visit for decompensated cirrhosis.    See prior notes for full details of his case. He was first seen by Mike GipAmy Esterwood in our office May 5 for new patient consult for cirrhosis and abdominal pain.  He was writhing in abdominal pain at that time and was referred to the hospital where he was subsequently admitted for further evaluation.  He was found to be jaundiced with elevated liver enzymes, ascites, and severe abdominal pain.   He had a CT scan, MRI liver, upper endoscopy, lab work-up, paracentesis as outlined below.  He did not have SBP.  He was started on Lasix and Aldactone for treatment of his ascites and lower extremity edema.  He was started on low-dose nadolol for small varices noted on EGD.  He did have an MRI showing cirrhosis of the liver with a small lesion in the liver that was indeterminant.  AFP was negative.  It was recommended he have a follow-up MRI in 3 months.   I reviewed his course with him and his prior history.  He previously has drank alcohol very heavily, upwards of 24 beers per day plus vodka for the past several years.  Wife present with him today, he stopped drinking completely on April 4 and has not had any further alcohol use.  He also stopped smoking cigarettes in February.  He has had ascites and lower extremity edema, mild hepatic encephalopathy, and jaundice in relation to his cirrhosis.  We have been monitoring his labs, he had a serologic work-up for other causes of liver disease which was negative.  He has been taking Lasix 40 mg a day as well as Aldactone 100 mg a day.  He appears to be tolerating this well and his renal function has been stable.  He does have some mild edema but he states he is been like that for a while and overall seems to be getting better.  He is following a low-sodium diet.  We have resumed the nadolol given his history of varices and he appears to be tolerating that as well.  He does have a  history of encephalopathy and has been on lactulose 3 times daily.  He states he has loose stools and a lot of bloating and gas that bothers him, likely from the lactulose.  He had a colonoscopy in 2018 but no report of that available.  He states his father either had colon cancer or stomach cancer diagnosed at age 6760s.,  he is not sure  Wife states he took some Aleve a few weeks ago and had a few dark stools.  Denied Pepto-Bismol use or iron use.  He never told me about it, he states it lasted 2 days or so and then resolved.  He stopped using all NSAIDs.  He he is currently passing brown stool.  He has been taking gabapentin for hiccups as needed and states it works quite well.  He continues to take oxycodone as needed given to him by his primary care for aches and pains, we have discussed that in the past.  Otherwise has been stable since of last seen him, bilirubin coming down.   CT abdomen pelvis scan 02/15/21: IMPRESSION: Hepatic cirrhosis, and findings of portal venous hypertension including esophageal varices and splenomegaly. Mild-to-moderate ascites. 2 cm ill-defined low-attenuation lesion in posterior right hepatic lobe. Neoplasm cannot be excluded. Abdomen MRI without and with contrast recommended for further characterization. Aortic Atherosclerosis (ICD10-I70.0).  MRI liver 02/16/21: IMPRESSION: 1. The previously demonstrated lesion of concern inferiorly in the right hepatic lobe has nonspecific MR features with delayed ring enhancement and ill-defined T2 signal. These are not the typical features of hepatocellular carcinoma, although non-HCC malignancy such as intrahepatic cholangiocarcinoma or metastatic disease cannot be excluded. Recommend MR follow-up in 3 months. This lesion is likely too small to evaluate or biopsy by ultrasound. 2. Morphologic changes of cirrhosis with secondary signs of portal hypertension. Mild-to-moderate ascites. 3. Prominent lymph nodes in the porta  hepatis, likely reactive.     EGD 02/21/21:Grade II and small (< 5 mm) esophageal varices with no bleeding and no stigmata of recent bleeding. - A small amount of food (residue) in the stomach. - Portal hypertensive gastropathy. - Normal examined duodenum. - No specimens collected.   Hep A / B vaccine 03/15/21, 04/17/21    Past Medical History:  Diagnosis Date   Alcoholism (HCC)    Anxiety    Ascites    Cirrhosis (HCC)    Colon polyps    Depression    GERD (gastroesophageal reflux disease)    Hypertension      Past Surgical History:  Procedure Laterality Date   ESOPHAGOGASTRODUODENOSCOPY (EGD) WITH PROPOFOL N/A 02/21/2021   Procedure: ESOPHAGOGASTRODUODENOSCOPY (EGD) WITH PROPOFOL;  Surgeon: Napoleon Form, MD;  Location: [facility];  Service: Endoscopy;  Laterality: N/A;   WISDOM TOOTH EXTRACTION     Family History  Problem Relation Age of Onset   Skin cancer Mother    Peripheral vascular disease Father    Stomach cancer Father    Congenital heart disease Maternal Grandmother    Heart attack Maternal Grandmother    CAD Maternal Grandmother    Dementia Maternal Grandfather    CAD Maternal Grandfather    Heart attack Maternal Grandfather    Skin cancer Maternal Grandfather    Congenital heart disease Paternal Grandmother    Lung cancer Paternal Grandmother    Cancer Paternal Grandfather    COPD Paternal Grandfather    Colon cancer Neg Hx    Colon polyps Neg Hx    Social History   Tobacco Use   Smoking status: Former    Packs/day: 3.00    Years: 15.00    Pack years: 45.00    Types: Cigarettes    Quit date: 11/24/2020    Years since quitting: 0.4   Smokeless tobacco: Never  Vaping Use   Vaping Use: Never used  Substance Use Topics   Alcohol use: Not Currently    Alcohol/week: 18.0 - 19.0 standard drinks    Types: 18 - 19 Cans of beer per week    Comment: 01/15/2021   Drug use: No   Current Outpatient Medications  Medication Sig Dispense Refill    folic acid (FOLVITE) 1 MG tablet Take 1 tablet (1 mg total) by mouth daily.     furosemide (LASIX) 40 MG tablet Take 1 tablet (40 mg total) by mouth daily. 90 tablet 0   gabapentin (NEURONTIN) 300 MG capsule TAKE 1 CAPSULE TWICE DAILY AS NEEDED FORHICCUPS 60 capsule 0   lactulose (CHRONULAC) 10 GM/15ML solution Take 30 mLs (20 g total) by mouth 3 (three) times daily. 1892 mL 2   multivitamin (PROSIGHT) TABS tablet Take 1 tablet by mouth daily. 30 tablet 0   oxyCODONE (OXYCONTIN) 10 mg 12 hr tablet Take 1 tablet (10 mg total) by mouth every 12 (twelve) hours. 10 tablet 0   pantoprazole (PROTONIX) 40 MG tablet Take 1 tablet (  40 mg total) by mouth daily. 90 tablet 1   spironolactone (ALDACTONE) 100 MG tablet Take 1 tablet (100 mg total) by mouth daily. 90 tablet 0   thiamine 100 MG tablet Take 1 tablet (100 mg total) by mouth daily.     No current facility-administered medications for this visit.   Allergies  Allergen Reactions   Atenolol    Codeine Itching   Wellbutrin [Bupropion]      Review of Systems: All systems reviewed and negative except where noted in HPI.   Lab Results  Component Value Date   WBC 10.4 02/21/2021   HGB 13.9 02/21/2021   HCT 41.5 02/21/2021   MCV 101.5 (H) 02/21/2021   PLT 137 (L) 02/21/2021    Lab Results  Component Value Date   CREATININE 0.97 04/26/2021   BUN 14 04/26/2021   NA 138 04/26/2021   K 4.2 04/26/2021   CL 106 04/26/2021   CO2 27 04/26/2021    Lab Results  Component Value Date   ALT 34 04/26/2021   AST 63 (H) 04/26/2021   ALKPHOS 114 04/26/2021   BILITOT 2.1 (H) 04/26/2021    Lab Results  Component Value Date   INR 1.5 (H) 02/21/2021   INR 1.7 (H) 02/20/2021   INR 1.7 (H) 02/19/2021     Physical Exam: BP 116/62 (BP Location: Left Arm, Patient Position: Sitting, Cuff Size: Normal)   Pulse 76   Ht 5\' 10"  (1.778 m)   Wt 253 lb 8 oz (115 kg)   BMI 36.37 kg/m  Constitutional: Pleasant,well-developed, male in no acute  distress. Abdominal: Soft, nondistended, nontender. There are no masses palpable.  Extremities: trace edema Neurological: Alert and oriented to person place and time. No asterixis Skin: Skin is warm and dry. No rashes noted. Psychiatric: Normal mood and affect. Behavior is normal.   ASSESSMENT AND PLAN: 55 year old male here for reassessment of the following:  Alcoholic cirrhosis with ascites Esophageal varices Hepatic encephalopathy Abnormal liver MRI  Overall doing quite well with alcohol abstinence and he will continue to do so.  His encephalopathy is well controlled with lactulose, in fact I think he can decrease the dosing a bit as he is having some diarrhea from this.  He can titrate that up or down to goal at least 3 bowel movements per day.  He is tolerating the diuretics, renal function stable, not much edema on exam today.  He has also resumed his nadolol for history of varices and appears to be tolerating that as well.  He did use some NSAIDs recently a few weeks ago and had a few dark stools, he does have portal hypertensive gastritis and perhaps that was worsened in the setting but it stopped quickly and passing brown stool.  We will check CBC to make sure stable.  He is also due for follow-up INR, will recheck his renal function liver function testing as well, he is going to go to the lab in the next week or so to get this done.  He was advised to stop all NSAIDs.  We will try again to get results from his prior colonoscopy to clarify findings and when he is due again.  So far he has been stable and doing well since of last seen him which is excellent news.  If for some reason he worsens in the interim will refer for transplant eval, however he is not yet a candidate given alcohol last use in April.  I will continue to follow him  closely, see him in the office in 3 to 4 months or sooner with any issues in the interim.  He agrees.  Of note, he was able to come off benzodiazepines with  his primary care, and working on weaning off narcotics, he is not taking this routinely anymore.  He is scheduled for an MRI next week to reevaluate liver lesion otherwise  Plan: - continue present meds - lasix, aldactone - feels edema is controlled enough does not want to escalate dosing - continue nadolol - continue lactulose, can reduce to BID dosing - MRI scheduled next week - CBC, CMET, INR - go to lab in 1 week - try to get report of Colonoscopy [facility] 2018 - no NSAIDs, call if any recurrent dark stools - f/u 3 months  Harlin Rain, MD [facility]

## 2021-05-10 ENCOUNTER — Other Ambulatory Visit: Payer: Self-pay | Admitting: Gastroenterology

## 2021-05-10 ENCOUNTER — Telehealth: Payer: Self-pay | Admitting: Gastroenterology

## 2021-05-10 NOTE — Telephone Encounter (Signed)
Inbound call from patient wife. Need refill for nadolol

## 2021-05-14 ENCOUNTER — Telehealth: Payer: Self-pay

## 2021-05-14 NOTE — Telephone Encounter (Signed)
Called and LM for patient to call back to discuss record of colonoscopy in 2018. Patient has not signed a release.

## 2021-05-16 ENCOUNTER — Encounter (HOSPITAL_COMMUNITY): Payer: Self-pay

## 2021-05-16 ENCOUNTER — Ambulatory Visit (HOSPITAL_COMMUNITY)
Admission: RE | Admit: 2021-05-16 | Discharge: 2021-05-16 | Disposition: A | Discharge status: Home / Self Care | Payer: Self-pay | Source: Ambulatory Visit | Attending: Gastroenterology | Admitting: Gastroenterology

## 2021-05-16 ENCOUNTER — Other Ambulatory Visit: Payer: Self-pay

## 2021-05-16 DIAGNOSIS — K7290 Hepatic failure, unspecified without coma: Secondary | ICD-10-CM

## 2021-05-16 DIAGNOSIS — R932 Abnormal findings on diagnostic imaging of liver and biliary tract: Secondary | ICD-10-CM

## 2021-05-16 DIAGNOSIS — I8500 Esophageal varices without bleeding: Secondary | ICD-10-CM

## 2021-05-16 DIAGNOSIS — R609 Edema, unspecified: Secondary | ICD-10-CM

## 2021-05-16 DIAGNOSIS — K7031 Alcoholic cirrhosis of liver with ascites: Secondary | ICD-10-CM

## 2021-05-16 DIAGNOSIS — K7682 Hepatic encephalopathy: Secondary | ICD-10-CM

## 2021-05-16 DIAGNOSIS — R066 Hiccough: Secondary | ICD-10-CM

## 2021-05-16 NOTE — Telephone Encounter (Signed)
Called patient again but could not leave a message, Note:  Patient was also mailed an AVS from his last office visit that indicated that we need more information in order to request records from his last colonoscopy.

## 2021-05-22 ENCOUNTER — Telehealth: Payer: Self-pay

## 2021-05-22 NOTE — Telephone Encounter (Addendum)
Spoke with patient's wife in regards to information below. Advised Judeth Cornfield that [facility] is the only location with a "wide bore" MRI machine. I have provided her with the phone number to [facility]. Advised that I will send RX to pharmacy on file. Judeth Cornfield is aware that if patient takes Valium he will need a driver that day. Judeth Cornfield verbalized understanding of all information and had no concerns at the end of the call.

## 2021-05-22 NOTE — Telephone Encounter (Signed)
Lm on vm for patient's wife, Judeth Cornfield, to return call.

## 2021-05-22 NOTE — Telephone Encounter (Signed)
Spoke with Adam Hartman, she states that patient went for MRI appt but could not complete the test. She states that he got really anxious and claustrophobic. Pt will need oral sedation prior to MRI and a wide bore machine. Patient's wife is aware that you are out of the office until late this week and we will give them a call as soon as we can. Please advise, thanks.

## 2021-05-22 NOTE — Telephone Encounter (Signed)
-----   Message from Benancio Deeds, MD sent at 05/22/2021 10:44 AM EDT ----- [name] I got a notice about this patient's MRI. Can you please confirm with him if he is scheduled for this, and if not, can you help reschedule him? It's very important he follows through with this. Thanks  ----- Message ----- From: SYSTEM Sent: 05/21/2021  12:13 AM EDT To: Benancio Deeds, MD

## 2021-05-23 ENCOUNTER — Other Ambulatory Visit: Payer: Self-pay | Admitting: Gastroenterology

## 2021-05-23 MED ORDER — DIAZEPAM 5 MG PO TABS: ORAL_TABLET | ORAL | 0 refills | Status: DC

## 2021-05-23 NOTE — Addendum Note (Signed)
Addended by: Missy Sabins on: 05/23/2021 08:37 AM   Modules accepted: Orders

## 2021-05-23 NOTE — Telephone Encounter (Signed)
Spoke with Judie Grieve at [facility] to call in prescription for Valium. Judie Grieve will put prescription on hold until pt has MRI scheduled.

## 2021-06-01 ENCOUNTER — Telehealth: Payer: Self-pay

## 2021-06-01 DIAGNOSIS — K7460 Unspecified cirrhosis of liver: Secondary | ICD-10-CM

## 2021-06-01 DIAGNOSIS — K7682 Hepatic encephalopathy: Secondary | ICD-10-CM

## 2021-06-01 DIAGNOSIS — K7031 Alcoholic cirrhosis of liver with ascites: Secondary | ICD-10-CM

## 2021-06-01 DIAGNOSIS — K7290 Hepatic failure, unspecified without coma: Secondary | ICD-10-CM

## 2021-06-01 DIAGNOSIS — R932 Abnormal findings on diagnostic imaging of liver and biliary tract: Secondary | ICD-10-CM

## 2021-06-01 NOTE — Telephone Encounter (Signed)
Spoke with Judeth Cornfield, she states that patient is trying to get everything done around the same time and will try to get things done at the first of the week. I reminded Judeth Cornfield that the valium prescription was sent in and is being held at the pharmacy until he schedules his MRI. Judeth Cornfield verbalized understanding of all information and had no concerns at the end of the call.

## 2021-06-01 NOTE — Telephone Encounter (Signed)
-----   Message from Missy Sabins, RN sent at 03/01/2021  9:43 AM EDT ----- Regarding: Labs/Imaging AFP and MRI liver - liver lesion.

## 2021-06-05 ENCOUNTER — Other Ambulatory Visit (HOSPITAL_COMMUNITY)
Admission: RE | Admit: 2021-06-05 | Discharge: 2021-06-05 | Disposition: A | Discharge status: Home / Self Care | Payer: Self-pay | Source: Ambulatory Visit | Attending: Gastroenterology | Admitting: Gastroenterology

## 2021-06-05 DIAGNOSIS — K7682 Hepatic encephalopathy: Secondary | ICD-10-CM

## 2021-06-05 DIAGNOSIS — R932 Abnormal findings on diagnostic imaging of liver and biliary tract: Secondary | ICD-10-CM

## 2021-06-05 DIAGNOSIS — I8500 Esophageal varices without bleeding: Secondary | ICD-10-CM

## 2021-06-05 DIAGNOSIS — K7031 Alcoholic cirrhosis of liver with ascites: Secondary | ICD-10-CM

## 2021-06-05 DIAGNOSIS — K7460 Unspecified cirrhosis of liver: Secondary | ICD-10-CM

## 2021-06-05 DIAGNOSIS — K7290 Hepatic failure, unspecified without coma: Secondary | ICD-10-CM | POA: Insufficient documentation

## 2021-06-05 LAB — PROTIME-INR
INR: 1.4 — ABNORMAL HIGH (ref 0.8–1.2)
Prothrombin Time: 17.5 seconds — ABNORMAL HIGH (ref 11.4–15.2)

## 2021-06-05 LAB — CBC WITH DIFFERENTIAL/PLATELET
Abs Immature Granulocytes: 0.02 10*3/uL (ref 0.00–0.07)
Basophils Absolute: 0.1 10*3/uL (ref 0.0–0.1)
Basophils Relative: 1 %
Eosinophils Absolute: 0.2 10*3/uL (ref 0.0–0.5)
Eosinophils Relative: 3 %
HCT: 34.9 % — ABNORMAL LOW (ref 39.0–52.0)
Hemoglobin: 12.5 g/dL — ABNORMAL LOW (ref 13.0–17.0)
Immature Granulocytes: 0 %
Lymphocytes Relative: 34 %
Lymphs Abs: 2.7 10*3/uL (ref 0.7–4.0)
MCH: 34.8 pg — ABNORMAL HIGH (ref 26.0–34.0)
MCHC: 35.8 g/dL (ref 30.0–36.0)
MCV: 97.2 fL (ref 80.0–100.0)
Monocytes Absolute: 0.6 10*3/uL (ref 0.1–1.0)
Monocytes Relative: 8 %
Neutro Abs: 4.2 10*3/uL (ref 1.7–7.7)
Neutrophils Relative %: 54 %
Platelets: 127 10*3/uL — ABNORMAL LOW (ref 150–400)
RBC: 3.59 MIL/uL — ABNORMAL LOW (ref 4.22–5.81)
RDW: 12.9 % (ref 11.5–15.5)
WBC: 7.9 10*3/uL (ref 4.0–10.5)
nRBC: 0 % (ref 0.0–0.2)

## 2021-06-05 LAB — COMPREHENSIVE METABOLIC PANEL
ALT: 33 U/L (ref 0–44)
AST: 60 U/L — ABNORMAL HIGH (ref 15–41)
Albumin: 3.5 g/dL (ref 3.5–5.0)
Alkaline Phosphatase: 131 U/L — ABNORMAL HIGH (ref 38–126)
Anion gap: 7 (ref 5–15)
BUN: 8 mg/dL (ref 6–20)
CO2: 24 mmol/L (ref 22–32)
Calcium: 9 mg/dL (ref 8.9–10.3)
Chloride: 102 mmol/L (ref 98–111)
Creatinine, Ser: 0.8 mg/dL (ref 0.61–1.24)
GFR, Estimated: >60 mL/min (ref >60)
Glucose, Bld: 151 mg/dL — ABNORMAL HIGH (ref 70–99)
Potassium: 3.8 mmol/L (ref 3.5–5.1)
Sodium: 133 mmol/L — ABNORMAL LOW (ref 135–145)
Total Bilirubin: 2.5 mg/dL — ABNORMAL HIGH (ref 0.3–1.2)
Total Protein: 7.7 g/dL (ref 6.5–8.1)

## 2021-06-06 LAB — AFP TUMOR MARKER: AFP, Serum, Tumor Marker: 6.1 ng/mL (ref 0.0–8.4)

## 2021-07-08 ENCOUNTER — Other Ambulatory Visit: Payer: Self-pay

## 2021-07-08 ENCOUNTER — Ambulatory Visit
Admission: RE | Admit: 2021-07-08 | Discharge: 2021-07-08 | Disposition: A | Discharge status: Home / Self Care | Payer: Self-pay | Source: Ambulatory Visit | Attending: Gastroenterology | Admitting: Gastroenterology

## 2021-07-08 DIAGNOSIS — K7682 Hepatic encephalopathy: Secondary | ICD-10-CM

## 2021-07-08 DIAGNOSIS — R609 Edema, unspecified: Secondary | ICD-10-CM

## 2021-07-08 DIAGNOSIS — K7031 Alcoholic cirrhosis of liver with ascites: Secondary | ICD-10-CM

## 2021-07-08 DIAGNOSIS — K7290 Hepatic failure, unspecified without coma: Secondary | ICD-10-CM

## 2021-07-08 DIAGNOSIS — R932 Abnormal findings on diagnostic imaging of liver and biliary tract: Secondary | ICD-10-CM

## 2021-07-08 DIAGNOSIS — I8500 Esophageal varices without bleeding: Secondary | ICD-10-CM

## 2021-07-08 DIAGNOSIS — R066 Hiccough: Secondary | ICD-10-CM

## 2021-07-09 ENCOUNTER — Telehealth: Payer: Self-pay | Admitting: Gastroenterology

## 2021-07-09 DIAGNOSIS — K769 Liver disease, unspecified: Secondary | ICD-10-CM

## 2021-07-09 DIAGNOSIS — K7031 Alcoholic cirrhosis of liver with ascites: Secondary | ICD-10-CM

## 2021-07-09 DIAGNOSIS — R932 Abnormal findings on diagnostic imaging of liver and biliary tract: Secondary | ICD-10-CM

## 2021-07-09 NOTE — Telephone Encounter (Signed)
Lm on vm for Stephanie to return call. 

## 2021-07-09 NOTE — Telephone Encounter (Signed)
Adam Hartman from [facility] called requesting a medical release to e faxed for them to send reports requested. Fax: [phone]

## 2021-07-09 NOTE — Telephone Encounter (Signed)
Sorry to hear this. He really needs this MRI to further evaluate a liver lesion he has.  Is there any way to schedule him an "open" MRI for patients with claustrophobia? That would be the best option. If we can't do that he would need either an anesthesia assisted MRI vs. CT scan liver with contrast. Can you let me know? Otherwise yes if we can obtain prior colonoscopy report that would be great. Thanks

## 2021-07-09 NOTE — Telephone Encounter (Signed)
Spoke with Judeth Cornfield, she reports that patient went for her MRI yesterday. He took Valium on the way to appt and felt it working and was able to enter MRI machine. Pt was given earplugs but when the machine started to make loud noises the patient panicked and had to be removed from the machine. Pt was not able to proceed. Judeth Cornfield also had the information for patient's last colonoscopy. She states that last colonoscopy was performed by Dr. Fraser Din at [facility] in [place] [facility] specialists), they can be contacted at [phone].   I called the Medical records department for [facility] surgical specialist and had to leave a vm for someone to return call. I left our fax number and patients information on vm and advised them to give me a call if they have any questions.

## 2021-07-09 NOTE — Telephone Encounter (Signed)
Inbound call from wife, Judeth Cornfield. States patient was sent to get MRI at [facility] but they were unable to complete the MRI because patient panic. States most likely the patient would have to have the MRI at the hospital. Best contact number [phone]

## 2021-07-10 NOTE — Telephone Encounter (Signed)
Lm on vm for Stephanie to return call.

## 2021-07-11 NOTE — Telephone Encounter (Signed)
Spoke with Judeth Cornfield in regards to information and recommendations. Pt states that she will discuss everything with patient tonight and will give me a call back tomorrow.

## 2021-07-17 NOTE — Telephone Encounter (Signed)
Received a vm from [place] stating that patient has agreed to proceed with MRI under sedation at [facility].

## 2021-07-18 NOTE — Telephone Encounter (Signed)
MRI liver order in epic with sedation (only done at [facility]). Secure staff message sent to Laurence Spates in radiology scheduling for assistance with scheduling.

## 2021-07-18 NOTE — Telephone Encounter (Signed)
Okay, thanks, unfortunately that seems to be his best option to evaluate this. Can you touch base with MRI to see how we can coordinate this? He needs MRI of liver with and without contrast under sedation, given intolerance of prior exam. Thanks

## 2021-07-19 NOTE — Telephone Encounter (Signed)
Completed H&P form along with 05/08/21 office note and medication list has been faxed back to [facility].   Judeth Cornfield is aware that we have faxed the form and radiology scheduling will contact them to set up the appt. Advised that if they have not heard from radiology scheduling by the end of next week to give them a call at [phone]. Judeth Cornfield verbalized understanding and had no concerns at the end of the call.

## 2021-07-19 NOTE — Telephone Encounter (Signed)
I completed the form, it's in my outbox. A lot of what they need is in my office clinic note, last seen 05/08/21. Can you please attach my office note from 05/08/21 for reference. Thank you

## 2021-07-19 NOTE — Telephone Encounter (Signed)
Laurence Spates states that she is no longer with the radiology department. She has put me in contact with Henry Russel. She has emailed me an H&P form that needs to be completed prior to scheduling patient. Forms will need to be faxed back to [phone].   I have placed the forms in your IN box for your review and signature. I will fax to radiology once complete.

## 2021-08-07 ENCOUNTER — Ambulatory Visit (HOSPITAL_COMMUNITY): Payer: Self-pay

## 2021-08-08 ENCOUNTER — Ambulatory Visit: Payer: Self-pay | Admitting: Gastroenterology

## 2021-08-15 ENCOUNTER — Encounter (HOSPITAL_COMMUNITY): Payer: Self-pay | Admitting: *Deleted

## 2021-08-15 ENCOUNTER — Other Ambulatory Visit: Payer: Self-pay

## 2021-08-15 ENCOUNTER — Telehealth: Payer: Self-pay

## 2021-08-15 DIAGNOSIS — K7682 Hepatic encephalopathy: Secondary | ICD-10-CM

## 2021-08-15 DIAGNOSIS — K769 Liver disease, unspecified: Secondary | ICD-10-CM

## 2021-08-15 DIAGNOSIS — R932 Abnormal findings on diagnostic imaging of liver and biliary tract: Secondary | ICD-10-CM

## 2021-08-15 DIAGNOSIS — K7031 Alcoholic cirrhosis of liver with ascites: Secondary | ICD-10-CM

## 2021-08-15 NOTE — Telephone Encounter (Signed)
I would like him to keep his appointment for Friday.  May be good to check a c-Met that day if he can.  Otherwise I will review the MRI with him on Friday.  Thanks

## 2021-08-15 NOTE — Telephone Encounter (Signed)
Received a voicemail from patient's wife, Judeth Cornfield, stating that patient is scheduled for his sedated MRI tomorrow at [facility]. Pt is on the schedule for a follow up with you this Friday, 08/17/21. Patient's wife wants to know if he needs to keep his appt for Friday or would you like to reschedule it for a few weeks out? Judeth Cornfield also wants to know if patient needs to come in for lab work? Please advise, thanks.

## 2021-08-15 NOTE — Progress Notes (Signed)
Spoke with wife Judeth Cornfield [phone]) for PAT information and instructions for DOS.  DUE TO COVID-19 ONLY ONE VISITOR IS ALLOWED TO COME WITH YOU AND STAY IN THE WAITING ROOM ONLY DURING PRE OP AND PROCEDURE DAY OF SURGERY.   PCP - Dr Quintin Alto Cardiologist - n/a Laurette Schimke - D Ileene Patrick  Chest x-ray - n/a EKG - 02/17/21 Stress Test - n/a ECHO - n/a Cardiac Cath - n/a  ICD Pacemaker/Loop - n/a  Sleep Study -  n/a CPAP - none  Anesthesia review: Yes  STOP now taking any Aspirin (unless otherwise instructed by your surgeon), Aleve, Naproxen, Ibuprofen, Motrin, Advil, Goody's, BC's, all herbal medications, fish oil, and all vitamins.   Coronavirus Screening Covid test n/a Ambulatory Surgery   Do you have any of the following symptoms:  Cough yes/no: No Fever (>100.58F)  yes/no: No Runny nose yes/no: No Sore throat yes/no: No Difficulty breathing/shortness of breath  yes/no: No  Have you traveled in the last 14 days and where? yes/no: No  Wife  Judeth Cornfield verbalized understanding of instructions that were given via phone.

## 2021-08-15 NOTE — Telephone Encounter (Signed)
Spoke with Judeth Cornfield in regards to recommendations. She will bring patient in about an hour before his appt for blood work. Judeth Cornfield verbalized understanding and had no concerns at the end of the call.

## 2021-08-15 NOTE — Addendum Note (Signed)
Addended by: Missy Sabins on: 08/15/2021 12:45 PM   Modules accepted: Orders

## 2021-08-16 ENCOUNTER — Encounter (HOSPITAL_COMMUNITY)
Admission: RE | Disposition: A | Discharge status: Home / Self Care | Payer: Self-pay | Source: Home / Self Care | Attending: Gastroenterology

## 2021-08-16 ENCOUNTER — Ambulatory Visit (HOSPITAL_COMMUNITY)
Admission: RE | Admit: 2021-08-16 | Discharge: 2021-08-16 | Disposition: A | Discharge status: Home / Self Care | Payer: Self-pay | Source: Ambulatory Visit | Attending: Gastroenterology | Admitting: Gastroenterology

## 2021-08-16 ENCOUNTER — Ambulatory Visit (HOSPITAL_COMMUNITY): Payer: Self-pay | Admitting: Physician Assistant

## 2021-08-16 ENCOUNTER — Encounter (HOSPITAL_COMMUNITY): Payer: Self-pay | Admitting: Gastroenterology

## 2021-08-16 ENCOUNTER — Ambulatory Visit (HOSPITAL_COMMUNITY)
Admission: RE | Admit: 2021-08-16 | Discharge: 2021-08-16 | Disposition: A | Discharge status: Home / Self Care | Payer: Self-pay | Attending: Gastroenterology | Admitting: Gastroenterology

## 2021-08-16 DIAGNOSIS — K7031 Alcoholic cirrhosis of liver with ascites: Secondary | ICD-10-CM | POA: Insufficient documentation

## 2021-08-16 DIAGNOSIS — R932 Abnormal findings on diagnostic imaging of liver and biliary tract: Secondary | ICD-10-CM

## 2021-08-16 DIAGNOSIS — K769 Liver disease, unspecified: Secondary | ICD-10-CM | POA: Insufficient documentation

## 2021-08-16 DIAGNOSIS — I10 Essential (primary) hypertension: Secondary | ICD-10-CM | POA: Insufficient documentation

## 2021-08-16 DIAGNOSIS — K219 Gastro-esophageal reflux disease without esophagitis: Secondary | ICD-10-CM | POA: Insufficient documentation

## 2021-08-16 DIAGNOSIS — Z87891 Personal history of nicotine dependence: Secondary | ICD-10-CM | POA: Insufficient documentation

## 2021-08-16 HISTORY — PX: RADIOLOGY WITH ANESTHESIA: SHX6223

## 2021-08-16 LAB — COMPREHENSIVE METABOLIC PANEL
ALT: 29 U/L (ref 0–44)
AST: 53 U/L — ABNORMAL HIGH (ref 15–41)
Albumin: 3.3 g/dL — ABNORMAL LOW (ref 3.5–5.0)
Alkaline Phosphatase: 105 U/L (ref 38–126)
Anion gap: 9 (ref 5–15)
BUN: 8 mg/dL (ref 6–20)
CO2: 23 mmol/L (ref 22–32)
Calcium: 9.4 mg/dL (ref 8.9–10.3)
Chloride: 101 mmol/L (ref 98–111)
Creatinine, Ser: 0.79 mg/dL (ref 0.61–1.24)
GFR, Estimated: >60 mL/min (ref >60)
Glucose, Bld: 169 mg/dL — ABNORMAL HIGH (ref 70–99)
Potassium: 4.1 mmol/L (ref 3.5–5.1)
Sodium: 133 mmol/L — ABNORMAL LOW (ref 135–145)
Total Bilirubin: 2.6 mg/dL — ABNORMAL HIGH (ref 0.3–1.2)
Total Protein: 7.2 g/dL (ref 6.5–8.1)

## 2021-08-16 SURGERY — MRI WITH ANESTHESIA
Anesthesia: General

## 2021-08-16 MED ORDER — PROPOFOL 10 MG/ML IV BOLUS
INTRAVENOUS | Status: DC | PRN
  Administered 2021-08-16: 200 mg via INTRAVENOUS

## 2021-08-16 MED ORDER — LACTATED RINGERS IV SOLN: INTRAVENOUS | Status: DC

## 2021-08-16 MED ORDER — GADOBUTROL 1 MMOL/ML IV SOLN
10.0000 mL | Freq: Once | INTRAVENOUS | Status: AC | PRN
  Administered 2021-08-16: 10 mL via INTRAVENOUS

## 2021-08-16 MED ORDER — ONDANSETRON HCL 4 MG/2ML IJ SOLN
INTRAMUSCULAR | Status: DC | PRN
  Administered 2021-08-16: 4 mg via INTRAVENOUS

## 2021-08-16 MED ORDER — MIDAZOLAM HCL 5 MG/5ML IJ SOLN
INTRAMUSCULAR | Status: DC | PRN
  Administered 2021-08-16: 2 mg via INTRAVENOUS

## 2021-08-16 MED ORDER — DEXAMETHASONE SODIUM PHOSPHATE 10 MG/ML IJ SOLN
INTRAMUSCULAR | Status: DC | PRN
Start: 2021-08-16 — End: 2021-08-16
  Administered 2021-08-16: 4 mg via INTRAVENOUS

## 2021-08-16 MED ORDER — CHLORHEXIDINE GLUCONATE 0.12 % MT SOLN
15.0000 mL | OROMUCOSAL | Status: DC
  Filled 2021-08-16 (×2): qty 15

## 2021-08-16 MED ORDER — LIDOCAINE 2% (20 MG/ML) 5 ML SYRINGE
INTRAMUSCULAR | Status: DC | PRN
  Administered 2021-08-16: 100 mg via INTRAVENOUS

## 2021-08-16 MED ORDER — PHENYLEPHRINE HCL-NACL 20-0.9 MG/250ML-% IV SOLN
INTRAVENOUS | Status: DC | PRN
  Administered 2021-08-16: 50 ug/min via INTRAVENOUS

## 2021-08-16 MED ORDER — FENTANYL CITRATE (PF) 100 MCG/2ML IJ SOLN
INTRAMUSCULAR | Status: DC | PRN
  Administered 2021-08-16: 100 ug via INTRAVENOUS

## 2021-08-16 NOTE — Anesthesia Postprocedure Evaluation (Signed)
Anesthesia Post Note  Patient: Adam Hartman  Procedure(s) Performed: MRI LIVER WITH AND WITHOUT CONTRAST     Patient location during evaluation: PACU Anesthesia Type: General Level of consciousness: awake and alert Pain management: pain level controlled Vital Signs Assessment: post-procedure vital signs reviewed and stable Respiratory status: spontaneous breathing, nonlabored ventilation, respiratory function stable and patient connected to nasal cannula oxygen Cardiovascular status: blood pressure returned to baseline and stable Postop Assessment: no apparent nausea or vomiting Anesthetic complications: no   No notable events documented.  Last Vitals:  Vitals:   08/16/21 1115 08/16/21 1130  BP: 135/68 126/62  Pulse: 71 65  Resp: 12 12  Temp: 37.1 C   SpO2: 98% 100%    Last Pain:  Vitals:   08/16/21 1115  TempSrc:   PainSc: 0-No pain                 Dagon Budai L Mileydi Milsap

## 2021-08-16 NOTE — Anesthesia Preprocedure Evaluation (Addendum)
Anesthesia Evaluation  Patient identified by MRN, date of birth, ID band Patient awake    Reviewed: Allergy & Precautions, NPO status , Patient's Chart, lab work & pertinent test results, reviewed documented beta blocker date and time   Airway Mallampati: III  TM Distance: >3 FB Neck ROM: Full    Dental  (+) Poor Dentition, Dental Advisory Given, Chipped,    Pulmonary neg pulmonary ROS, former smoker,    Pulmonary exam normal breath sounds clear to auscultation       Cardiovascular hypertension, Pt. on home beta blockers and Pt. on medications negative cardio ROS Normal cardiovascular exam Rhythm:Regular Rate:Normal     Neuro/Psych PSYCHIATRIC DISORDERS Anxiety Depression negative neurological ROS     GI/Hepatic GERD  Controlled and Medicated,(+) Cirrhosis   ascites  substance abuse  alcohol use,   Endo/Other  negative endocrine ROS  Renal/GU negative Renal ROS  negative genitourinary   Musculoskeletal negative musculoskeletal ROS (+)   Abdominal   Peds  Hematology negative hematology ROS (+)   Anesthesia Other Findings   Reproductive/Obstetrics                            Anesthesia Physical Anesthesia Plan  ASA: 3  Anesthesia Plan: General   Post-op Pain Management:    Induction: Intravenous  PONV Risk Score and Plan: 2 and Ondansetron, Dexamethasone and Midazolam  Airway Management Planned: LMA  Additional Equipment:   Intra-op Plan:   Post-operative Plan: Extubation in OR  Informed Consent: I have reviewed the patients History and Physical, chart, labs and discussed the procedure including the risks, benefits and alternatives for the proposed anesthesia with the patient or authorized representative who has indicated his/her understanding and acceptance.     Dental advisory given  Plan Discussed with: CRNA  Anesthesia Plan Comments:         Anesthesia Quick  Evaluation

## 2021-08-16 NOTE — Anesthesia Procedure Notes (Signed)
Procedure Name: LMA Insertion Date/Time: 08/16/2021 9:47 AM Performed by: Adria Dill, CRNA Pre-anesthesia Checklist: Patient identified, Emergency Drugs available, Suction available and Patient being monitored Patient Re-evaluated:Patient Re-evaluated prior to induction Oxygen Delivery Method: Circle system utilized Preoxygenation: Pre-oxygenation with 100% oxygen Induction Type: IV induction Ventilation: Mask ventilation without difficulty LMA: LMA inserted LMA Size: 5.0 Number of attempts: 1 Placement Confirmation: positive ETCO2 and breath sounds checked- equal and bilateral Tube secured with: Tape Dental Injury: Teeth and Oropharynx as per pre-operative assessment

## 2021-08-16 NOTE — Transfer of Care (Signed)
Immediate Anesthesia Transfer of Care Note  Patient: Adam Hartman  Procedure(s) Performed: MRI LIVER WITH AND WITHOUT CONTRAST  Patient Location: PACU  Anesthesia Type:General  Level of Consciousness: drowsy and patient cooperative  Airway & Oxygen Therapy: Patient Spontanous Breathing and Patient connected to nasal cannula oxygen  Post-op Assessment: Report given to RN and Post -op Vital signs reviewed and stable  Post vital signs: Reviewed and stable  Last Vitals:  Vitals Value Taken Time  BP 135/68 08/16/21 1116  Temp    Pulse    Resp    SpO2    Vitals shown include unvalidated device data.  Last Pain:  Vitals:   08/16/21 0754  TempSrc:   PainSc: 0-No pain      Patients Stated Pain Goal: 0 (08/16/21 0754)  Complications: No notable events documented.

## 2021-08-17 ENCOUNTER — Other Ambulatory Visit (INDEPENDENT_AMBULATORY_CARE_PROVIDER_SITE_OTHER): Payer: Self-pay

## 2021-08-17 ENCOUNTER — Ambulatory Visit (INDEPENDENT_AMBULATORY_CARE_PROVIDER_SITE_OTHER): Payer: Self-pay | Admitting: Gastroenterology

## 2021-08-17 ENCOUNTER — Encounter (HOSPITAL_COMMUNITY): Payer: Self-pay | Admitting: Radiology

## 2021-08-17 VITALS — BP 120/66 | HR 69 | Ht 71.0 in | Wt 260.2 lb

## 2021-08-17 DIAGNOSIS — K7031 Alcoholic cirrhosis of liver with ascites: Secondary | ICD-10-CM

## 2021-08-17 DIAGNOSIS — K7682 Hepatic encephalopathy: Secondary | ICD-10-CM

## 2021-08-17 DIAGNOSIS — I8510 Secondary esophageal varices without bleeding: Secondary | ICD-10-CM

## 2021-08-17 DIAGNOSIS — I8500 Esophageal varices without bleeding: Secondary | ICD-10-CM

## 2021-08-17 DIAGNOSIS — R932 Abnormal findings on diagnostic imaging of liver and biliary tract: Secondary | ICD-10-CM

## 2021-08-17 DIAGNOSIS — K769 Liver disease, unspecified: Secondary | ICD-10-CM

## 2021-08-17 DIAGNOSIS — R109 Unspecified abdominal pain: Secondary | ICD-10-CM

## 2021-08-17 DIAGNOSIS — F32A Depression, unspecified: Secondary | ICD-10-CM

## 2021-08-17 LAB — COMPREHENSIVE METABOLIC PANEL
ALT: 25 U/L (ref 0–53)
AST: 42 U/L — ABNORMAL HIGH (ref 0–37)
Albumin: 3.8 g/dL (ref 3.5–5.2)
Alkaline Phosphatase: 105 U/L (ref 39–117)
BUN: 14 mg/dL (ref 6–23)
CO2: 27 mEq/L (ref 19–32)
Calcium: 9.9 mg/dL (ref 8.4–10.5)
Chloride: 97 mEq/L (ref 96–112)
Creatinine, Ser: 1.01 mg/dL (ref 0.40–1.50)
GFR: 83.88 mL/min (ref >60.00)
Glucose, Bld: 399 mg/dL — ABNORMAL HIGH (ref 70–99)
Potassium: 4.7 mEq/L (ref 3.5–5.1)
Sodium: 129 mEq/L — ABNORMAL LOW (ref 135–145)
Total Bilirubin: 2.9 mg/dL — ABNORMAL HIGH (ref 0.2–1.2)
Total Protein: 7.5 g/dL (ref 6.0–8.3)

## 2021-08-17 NOTE — Patient Instructions (Addendum)
If you are age 55 or older, your body mass index should be between 23-30. Your Body mass index is 36.3 kg/m. If this is out of the aforementioned range listed, please consider follow up with your Primary Care Provider.  If you are age 40 or younger, your body mass index should be between 19-25. Your Body mass index is 36.3 kg/m. If this is out of the aformentioned range listed, please consider follow up with your Primary Care Provider.   ________________________________________________________  The  GI providers would like to encourage you to use [facility] to communicate with providers for non-urgent requests or questions.  Due to long hold times on the telephone, sending your provider a message by [facility] may be a faster and more efficient way to get a response.  Please allow 48 business hours for a response.  Please remember that this is for non-urgent requests.  _______________________________________________________  Continue all medications.  Except: Please hold your nadolol for 1 to 2 weeks to see if that improves how you are feeling.  Minimize your narcotic use. Use over-the-counter Lidocaine patches.  We will refer you to Behavioral Health for depression.  Please follow up in 6 months.  Thank you for entrusting me with your care and for choosing [facility], Dr. Ileene Patrick

## 2021-08-17 NOTE — Progress Notes (Signed)
HPI :  55 year old male here for a follow-up visit for decompensated cirrhosis. He was last seen on 05/08/21.   See prior notes for full details of his case. He was first seen by Mike Gip in our office May 5 for new patient consult for cirrhosis and abdominal pain.  He was writhing in abdominal pain at that time and was referred to the hospital where he was subsequently admitted for further evaluation.  He was found to be jaundiced with elevated liver enzymes, ascites, and severe abdominal pain. He had a CT scan, MRI liver, upper endoscopy, lab work-up, paracentesis as outlined below.  He did not have SBP.  He was started on Lasix and Aldactone for treatment of his ascites and lower extremity edema.  He was started on low-dose nadolol for small varices noted on EGD.  He did have an MRI showing cirrhosis of the liver with a small lesion in the liver that was indeterminant.  AFP was negative.  It was recommended he have a follow-up MRI in 3 months.  His MRI as outpatient was not tolerated, I then gave him some Valium to see if that would help relax him and again it was not able to be completed.  He does have this done with anesthesia assistance to sedate him for it.  Results as below.  Recall that he previously has drank alcohol very heavily, upwards of 24 beers per day plus vodka for the past several years.  Wife present with him today, he stopped drinking completely on April 4 and has not had any further alcohol use.  He also stopped smoking cigarettes in February.    He has been doing better since his last visit with me but overall stable.  He continues to take Lasix 40 mg daily and Aldactone 100 mg daily and his lower extremity edema is controlled on the regimen.  He takes his lactulose twice daily, states his encephalopathy is well controlled and confirmed by his wife.  He has increased the dose to 3 times daily as needed as he sometimes is constipated.  He takes oxycodone 5 mg twice daily  prescribed by Dr. Leandrew Koyanagi for his ongoing abdominal pain.  This was quite severe which led to his initial hospitalization, imaging studies have not shown any clear cause, endoscopy was unremarkable for cause.  Thought to be neuropathic or musculoskeletal pain.  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  He states when he is on gabapentin hiccups are controlled but if he stops it they tend to come back.  He is not sure if that may also be helping his abdominal pain.  He states the oxycodone definitely improves it.  The abdominal pain is present constantly over his lower abdomen and into his flanks.  He is quite tender to the touch and tightfitting pants can make it feel worse.  He has been on nadolol for esophageal varices.  He has noticed feeling down and feelings of depression, and weakness/lethargy.  He is not taking anything for depression.  He is not drinking any alcohol at all.  He is avoiding all NSAIDs.  We discussed options on how to treat that.  Of note we have still not been able to get the report from his prior colonoscopy done in 2018 or so.  Prior workup: CT abdomen pelvis scan 02/15/21: IMPRESSION: Hepatic cirrhosis, and findings of portal venous hypertension including esophageal varices and splenomegaly. Mild-to-moderate ascites. 2 cm ill-defined low-attenuation lesion in posterior right hepatic lobe.  Neoplasm cannot be excluded. Abdomen MRI without and with contrast recommended for further characterization. Aortic Atherosclerosis (ICD10-I70.0).     MRI liver 02/16/21: IMPRESSION: 1. The previously demonstrated lesion of concern inferiorly in the right hepatic lobe has nonspecific MR features with delayed ring enhancement and ill-defined T2 signal. These are not the typical features of hepatocellular carcinoma, although non-HCC malignancy such as intrahepatic cholangiocarcinoma or metastatic disease cannot be excluded. Recommend MR follow-up in 3 months. This  lesion is likely too small to evaluate or biopsy by ultrasound. 2. Morphologic changes of cirrhosis with secondary signs of portal hypertension. Mild-to-moderate ascites. 3. Prominent lymph nodes in the porta hepatis, likely reactive.     EGD 02/21/21:Grade II and small (< 5 mm) esophageal varices with no bleeding and no stigmata of recent bleeding. - A small amount of food (residue) in the stomach. - Portal hypertensive gastropathy. - Normal examined duodenum. - No specimens collected.   Hep A / B vaccine 03/15/21, 04/17/21    MRI liver with / without contrast - 08/16/21 -  IMPRESSION: 1. A previously described focus of ill-defined T2 hyperintensity in the inferior right lobe of the liver, hepatic segment VI, is not noted on current examination, nor is previously described rim hypoenhancement noted on multiphasic imaging. This is of uncertain significance and may reflect resolution of a small abscess or other nonspecific infection or inflammation given initial presentation of abdominal pain. 2. No suspicious liver lesion or contrast enhancement at this time. 3. Cirrhosis and splenomegaly. 4. Trace ascites.  Past Medical History:  Diagnosis Date   Alcoholism (HCC) 06/06/2012   Anxiety    panic attacks   Ascites 02/08/2021   Cirrhosis (HCC) 02/08/2021   Colon polyps    Depression    GERD (gastroesophageal reflux disease)    02/08/21   Hypertension      Past Surgical History:  Procedure Laterality Date   colonoscopy  12/2016   ESOPHAGOGASTRODUODENOSCOPY (EGD) WITH PROPOFOL N/A 02/21/2021   Procedure: ESOPHAGOGASTRODUODENOSCOPY (EGD) WITH PROPOFOL;  Surgeon: Napoleon Form, MD;  Location: [facility] END[facility];  Service: Endoscopy;  Laterality: N/A;   RADIOLOGY WITH ANESTHESIA N/A 08/16/2021   Procedure: MRI LIVER WITH AND WITHOUT CONTRAST;  Surgeon: Radiologist, Medication, MD;  Location: [facility] OR;  Service: Radiology;  Laterality: N/A;   WISDOM TOOTH EXTRACTION     Family  History  Problem Relation Age of Onset   Skin cancer Mother    Peripheral vascular disease Father    Stomach cancer Father    Congenital heart disease Maternal Grandmother    Heart attack Maternal Grandmother    CAD Maternal Grandmother    Dementia Maternal Grandfather    CAD Maternal Grandfather    Heart attack Maternal Grandfather    Skin cancer Maternal Grandfather    Congenital heart disease Paternal Grandmother    Lung cancer Paternal Grandmother    Cancer Paternal Grandfather    COPD Paternal Grandfather    Colon cancer Neg Hx    Colon polyps Neg Hx    Social History   Tobacco Use   Smoking status: Former    Packs/day: 3.00    Years: 15.00    Pack years: 45.00    Types: Cigarettes    Quit date: 11/24/2020    Years since quitting: 0.7   Smokeless tobacco: Never  Vaping Use   Vaping Use: Never used  Substance Use Topics   Alcohol use: Not Currently    Alcohol/week: 18.0 - 19.0 standard drinks    Types:  18 - 19 Cans of beer per week    Comment: 01/15/2021   Drug use: No   Current Outpatient Medications  Medication Sig Dispense Refill   folic acid (FOLVITE) 1 MG tablet Take 1 tablet (1 mg total) by mouth daily.     furosemide (LASIX) 40 MG tablet Take 1 tablet (40 mg total) by mouth daily. 90 tablet 0   gabapentin (NEURONTIN) 300 MG capsule TAKE 1 CAPSULE TWICE DAILY AS NEEDED FORHICCUPS 30 capsule 1   lactulose (CHRONULAC) 10 GM/15ML solution Take 30 mLs (20 g total) by mouth 2 (two) times daily. 1892 mL 2   multivitamin (PROSIGHT) TABS tablet Take 1 tablet by mouth daily. 30 tablet 0   nadolol (CORGARD) 20 MG tablet TAKE ONE (1) TABLET BY MOUTH EVERY DAY 90 tablet 1   oxyCODONE (OXY IR/ROXICODONE) 5 MG immediate release tablet Take 5 mg by mouth in the morning and at bedtime.     pantoprazole (PROTONIX) 40 MG tablet Take 1 tablet (40 mg total) by mouth daily. 90 tablet 1   spironolactone (ALDACTONE) 100 MG tablet Take 1 tablet (100 mg total) by mouth daily. 90 tablet  0   thiamine 100 MG tablet Take 1 tablet (100 mg total) by mouth daily.     No current facility-administered medications for this visit.   Allergies  Allergen Reactions   Atenolol     fatigue   Codeine Itching   Wellbutrin [Bupropion]     Made him smoke more     Review of Systems: All systems reviewed and negative except where noted in HPI.    MR LIVER W WO CONTRAST  Result Date: 08/17/2021 CLINICAL DATA:  Follow-up liver lesion cirrhosis EXAM: MRI ABDOMEN WITHOUT AND WITH CONTRAST TECHNIQUE: Multiplanar multisequence MR imaging of the abdomen was performed both before and after the administration of intravenous contrast. CONTRAST:  10mL GADAVIST GADOBUTROL 1 MMOL/ML IV SOLN COMPARISON:  02/16/2021 FINDINGS: Lower chest: Dependent bibasilar atelectasis or consolidation. Cardiomegaly. Bilateral gynecomastia. Hepatobiliary: Hepatomegaly, maximum coronal span 22.1 cm. Coarse, nodular contour of the liver. Coarse, heterogeneous background parenchymal enhancement. A previously described focus of ill-defined T2 hyperintensity in the inferior right lobe of the liver, hepatic segment VI, is not noted on current examination, nor is previously described rim hypoenhancement noted on multiphasic imaging (series 1004, image 74, series 4, image 26). There are subcentimeter fluid signal, nonenhancing simple cysts in the adjacent inferior right lobe of the liver, which are both clearly distinct from the previously described lesion and present prior examination. No gallstones. No biliary duct dilatation. Pancreas: No mass, inflammatory changes, or other parenchymal abnormality identified. Spleen:  Splenomegaly, maximum coronal span 15.8 cm. Adrenals/Urinary Tract: No masses identified. No evidence of hydronephrosis. Stomach/Bowel: Visualized portions within the abdomen are unremarkable. Vascular/Lymphatic: No pathologically enlarged lymph nodes identified. No abdominal aortic aneurysm demonstrated. Other:  Trace  perihepatic ascites. Musculoskeletal: No suspicious bone lesions identified. IMPRESSION: 1. A previously described focus of ill-defined T2 hyperintensity in the inferior right lobe of the liver, hepatic segment VI, is not noted on current examination, nor is previously described rim hypoenhancement noted on multiphasic imaging. This is of uncertain significance and may reflect resolution of a small abscess or other nonspecific infection or inflammation given initial presentation of abdominal pain. 2. No suspicious liver lesion or contrast enhancement at this time. 3. Cirrhosis and splenomegaly. 4. Trace ascites. Electronically Signed   By: Jearld Lesch M.D.   On: 08/17/2021 09:15     Lab Results  Component Value Date   CREATININE 1.01 08/17/2021   BUN 14 08/17/2021   NA 129 (L) 08/17/2021   K 4.7 08/17/2021   CL 97 08/17/2021   CO2 27 08/17/2021    Lab Results  Component Value Date   ALT 25 08/17/2021   AST 42 (H) 08/17/2021   ALKPHOS 105 08/17/2021   BILITOT 2.9 (H) 08/17/2021    Lab Results  Component Value Date   WBC 7.9 06/05/2021   HGB 12.5 (L) 06/05/2021   HCT 34.9 (L) 06/05/2021   MCV 97.2 06/05/2021   PLT 127 (L) 06/05/2021     Physical Exam: BP 120/66   Pulse 69   Ht 5\' 11"  (1.803 m)   Wt 260 lb 4 oz (118 kg)   SpO2 97%   BMI 36.30 kg/m  Constitutional: Pleasant,well-developed, male in no acute distress. Abdominal: Soft, nondistended, diffuse mild tenderness lower abdomen..  Neurological: Alert and oriented to person place and time. Skin: Skin is warm and dry. No rashes noted. Psychiatric: Normal mood and affect. Behavior is normal.   ASSESSMENT AND PLAN: 55 year old male here for reassessment of following:  Alcoholic cirrhosis with ascites Esophageal varices Hepatic encephalopathy Abdominal pain Depression  History as outlined above.  Decompensated cirrhosis with ascites, varices, hepatic encephalopathy.  He had been pretty stable since I last seen  him, volume status appears appropriate, he is tolerating diuretics.  He has been on nadolol and has endorsed feelings of worsening depression and fatigue, generally down.  Not sure if the nadolol is driving this or not, it certainly seems to be contributing and timeline had stopped.  I think we can hold the nadolol for 1 to 2 weeks just to see how he feels off of it.  If he states he feels much better off of it then may hold off on beta-blockade and do more frequent surveillance of his varices or treatment with banding.  That being said, I will refer him to behavioral health for symptoms of depression as well and defer to them if he would want medical therapy for this.  He and wife are agreeable to this as well.  Otherwise he has ongoing narcotic use for his abdominal pain.  We discussed that long-term narcotics are not in his best interest given his history of encephalopathy, but they are controlling his abdominal pain.  He has had multiple imaging studies of his abdomen which have been negative and he has persistent severe tenderness diffusely.  I suspect he probably has neuropathic pain.  He is on gabapentin for this.  We discussed other options such as lidocaine patches or capsaicin cream, both over-the-counter, recommend he try this.  If this is helpful he should wean down on his narcotic use.  He should continue his lactulose 3 times daily.  He is due for repeat labs in February.  Fortunately his MRI yesterday showed no evidence of HCC, in fact the lesion that was previously noted was not even seen.  We will repeat an ultrasound in May.  He will continue alcohol abstinence.  Plan: - continued alcohol abstinence - continue lasix / aldactone - continue lactulose - hold nadolol for 1-2 weeks - if makes a significant difference for him he will contact me - referral to behavioral health for depression - minimize narcotic use - trial of lidocaine patches or capsaicin cream for pain - labs in February -  AFP, CMET, INR - repeat US in 6 months - obtain report of prior colonoscopy - follow up  in clinic in 6 months  Harlin Rain, MD [facility]

## 2021-08-22 ENCOUNTER — Telehealth: Payer: Self-pay

## 2021-08-22 NOTE — Telephone Encounter (Signed)
Called and spoke to [place] at [facility] in [place].  Faxed request for records of colon and path from 2018 last week on 11-4. Kennyth Arnold indicated she will fax the reports to [place] at [phone] now. Number is [phone]

## 2021-08-22 NOTE — Telephone Encounter (Signed)
Records received and placed on Armbruster's desk for review

## 2021-08-24 ENCOUNTER — Other Ambulatory Visit: Payer: Self-pay | Admitting: Gastroenterology

## 2021-08-27 ENCOUNTER — Telehealth: Payer: Self-pay | Admitting: Gastroenterology

## 2021-08-27 NOTE — Telephone Encounter (Signed)
Records from prior colonoscopy arrived:  02/10/2017 - Dr. Tiburcio Bash [facility] - "excellent prep", one 5-90mm polyp removed from right colon via hot snare - c/w adenoma on path, otherwise normal exam.  Based on national guidelines would recommend a repeat exam in 7 years for surveillance.  Jan can you please place a recall for 01/2024 for colonoscopy for this patient? Thanks

## 2021-08-31 ENCOUNTER — Other Ambulatory Visit: Payer: Self-pay | Admitting: Gastroenterology

## 2021-09-18 ENCOUNTER — Ambulatory Visit (INDEPENDENT_AMBULATORY_CARE_PROVIDER_SITE_OTHER): Payer: Self-pay | Admitting: Gastroenterology

## 2021-09-18 VITALS — Ht 71.0 in

## 2021-09-18 DIAGNOSIS — K7682 Hepatic encephalopathy: Secondary | ICD-10-CM

## 2021-09-18 DIAGNOSIS — K7031 Alcoholic cirrhosis of liver with ascites: Secondary | ICD-10-CM

## 2021-09-18 DIAGNOSIS — Z23 Encounter for immunization: Secondary | ICD-10-CM

## 2021-09-18 NOTE — Progress Notes (Signed)
Pt arrived today for 3rd Twinrix vaccine. Since we no longer offer that at the office the patient received 1 ml of Hep A vaccine and two 0.5 ml Energix B vaccines. Pt tolerated vaccines well. No complications noted. Common side effects discussed with patient. Pt verbalized understanding and had no concerns.

## 2021-10-03 ENCOUNTER — Other Ambulatory Visit: Payer: Self-pay | Admitting: Gastroenterology

## 2021-10-17 ENCOUNTER — Other Ambulatory Visit: Payer: Self-pay | Admitting: Gastroenterology

## 2021-11-15 ENCOUNTER — Telehealth: Payer: Self-pay

## 2021-11-15 DIAGNOSIS — K7682 Hepatic encephalopathy: Secondary | ICD-10-CM

## 2021-11-15 DIAGNOSIS — I8500 Esophageal varices without bleeding: Secondary | ICD-10-CM

## 2021-11-15 DIAGNOSIS — K7031 Alcoholic cirrhosis of liver with ascites: Secondary | ICD-10-CM

## 2021-11-15 NOTE — Telephone Encounter (Signed)
MyChart message and letter sent to patient to go for labs. Orders placed

## 2021-11-15 NOTE — Telephone Encounter (Signed)
-----   Message from Cooper Render, CMA sent at 08/17/2021  5:51 PM EDT ----- Regarding: due for labs IN FEB Patient due for CMET, AFP and Pt/INR in February

## 2021-11-19 ENCOUNTER — Other Ambulatory Visit: Payer: Self-pay | Admitting: Gastroenterology

## 2021-11-22 ENCOUNTER — Other Ambulatory Visit (HOSPITAL_COMMUNITY)
Admission: RE | Admit: 2021-11-22 | Discharge: 2021-11-22 | Disposition: A | Discharge status: Home / Self Care | Payer: 59 | Source: Ambulatory Visit | Attending: Gastroenterology | Admitting: Gastroenterology

## 2021-11-22 DIAGNOSIS — K7682 Hepatic encephalopathy: Secondary | ICD-10-CM | POA: Diagnosis present

## 2021-11-22 DIAGNOSIS — I8500 Esophageal varices without bleeding: Secondary | ICD-10-CM | POA: Diagnosis present

## 2021-11-22 DIAGNOSIS — K7031 Alcoholic cirrhosis of liver with ascites: Secondary | ICD-10-CM | POA: Insufficient documentation

## 2021-11-22 LAB — PROTIME-INR
INR: 1.3 — ABNORMAL HIGH (ref 0.8–1.2)
Prothrombin Time: 16.5 seconds — ABNORMAL HIGH (ref 11.4–15.2)

## 2021-11-23 LAB — AFP TUMOR MARKER: AFP, Serum, Tumor Marker: 3.3 ng/mL (ref 0.0–8.4)

## 2021-12-06 ENCOUNTER — Other Ambulatory Visit: Payer: Self-pay | Admitting: Gastroenterology

## 2021-12-19 ENCOUNTER — Other Ambulatory Visit: Payer: Self-pay | Admitting: Gastroenterology

## 2022-01-11 ENCOUNTER — Other Ambulatory Visit: Payer: Self-pay | Admitting: Gastroenterology

## 2022-01-29 ENCOUNTER — Other Ambulatory Visit (INDEPENDENT_AMBULATORY_CARE_PROVIDER_SITE_OTHER): Payer: 59

## 2022-01-29 ENCOUNTER — Ambulatory Visit (INDEPENDENT_AMBULATORY_CARE_PROVIDER_SITE_OTHER): Payer: 59 | Admitting: Gastroenterology

## 2022-01-29 ENCOUNTER — Encounter: Payer: Self-pay | Admitting: Gastroenterology

## 2022-01-29 VITALS — BP 134/70 | HR 64 | Ht 71.0 in | Wt 272.6 lb

## 2022-01-29 DIAGNOSIS — R600 Localized edema: Secondary | ICD-10-CM

## 2022-01-29 DIAGNOSIS — K7031 Alcoholic cirrhosis of liver with ascites: Secondary | ICD-10-CM | POA: Diagnosis not present

## 2022-01-29 DIAGNOSIS — R0600 Dyspnea, unspecified: Secondary | ICD-10-CM

## 2022-01-29 DIAGNOSIS — I8500 Esophageal varices without bleeding: Secondary | ICD-10-CM

## 2022-01-29 DIAGNOSIS — R109 Unspecified abdominal pain: Secondary | ICD-10-CM | POA: Diagnosis not present

## 2022-01-29 DIAGNOSIS — K7682 Hepatic encephalopathy: Secondary | ICD-10-CM

## 2022-01-29 DIAGNOSIS — I8510 Secondary esophageal varices without bleeding: Secondary | ICD-10-CM

## 2022-01-29 LAB — COMPREHENSIVE METABOLIC PANEL
ALT: 37 U/L (ref 0–53)
AST: 56 U/L — ABNORMAL HIGH (ref 0–37)
Albumin: 4 g/dL (ref 3.5–5.2)
Alkaline Phosphatase: 131 U/L — ABNORMAL HIGH (ref 39–117)
BUN: 12 mg/dL (ref 6–23)
CO2: 25 mEq/L (ref 19–32)
Calcium: 9.8 mg/dL (ref 8.4–10.5)
Chloride: 101 mEq/L (ref 96–112)
Creatinine, Ser: 0.69 mg/dL (ref 0.40–1.50)
GFR: 104.05 mL/min (ref >60.00)
Glucose, Bld: 153 mg/dL — ABNORMAL HIGH (ref 70–99)
Potassium: 4.7 mEq/L (ref 3.5–5.1)
Sodium: 132 mEq/L — ABNORMAL LOW (ref 135–145)
Total Bilirubin: 2.1 mg/dL — ABNORMAL HIGH (ref 0.2–1.2)
Total Protein: 7.5 g/dL (ref 6.0–8.3)

## 2022-01-29 MED ORDER — LACTULOSE 10 GM/15ML PO SOLN: 30.0000 g | Freq: Three times a day (TID) | ORAL | 2 refills | Status: DC

## 2022-01-29 MED ORDER — AMILORIDE HCL 5 MG PO TABS: 10.0000 mg | ORAL_TABLET | Freq: Two times a day (BID) | ORAL | 3 refills | Status: DC

## 2022-01-29 NOTE — Patient Instructions (Addendum)
If you are age 56 or older, your body mass index should be between 23-30. Your Body mass index is 38.02 kg/m?Marland Kitchen If this is out of the aforementioned range listed, please consider follow up with your Primary Care Provider. ? ?If you are age 46 or younger, your body mass index should be between 19-25. Your Body mass index is 38.02 kg/m?Marland Kitchen If this is out of the aformentioned range listed, please consider follow up with your Primary Care Provider.  ? ?________________________________________________________ ? ?The [place] [facility] providers would like to encourage you to use [facility] to communicate with providers for non-urgent requests or questions.  Due to long hold times on the telephone, sending your provider a message by [facility] may be a faster and more efficient way to get a response.  Please allow 48 business hours for a response.  Please remember that this is for non-urgent requests.  ?_______________________________________________________ ? ?Please go to the lab in the basement of our building to have lab work done as you leave today. Hit "B" for basement when you get on the elevator.  When the doors open the lab is on your left.  We will call you with the results. Thank you. ? ?You will be contacted by [facility] in the next 2 days to arrange a Abdominal Ultrasound.  The number on your caller ID will be [phone], please answer when they call.  If you have not heard from them in 2 days please call [phone] to schedule.    ? ?Use lidocaine patches as needed. ? ?You have been scheduled for an Echocardiogram at [facility] on May 2nd at 8:30 am. Please arrive at 8:15 am.   ? ? ?We have sent the following medications to your pharmacy for you to pick up at your convenience: ?Amiloride 10 mg: Take twice a day. Stop aldactone. ?Lactulose: Take 30 grams three times a day ? ?Thank you for entrusting me with your care and for choosing [employer] [facility], ?Dr. Gary Cellar ? ? ? ? ? ?

## 2022-01-29 NOTE — Progress Notes (Signed)
? ?HPI :  ?56 year old male here for a follow-up visit for decompensated cirrhosis.  ? ?Recall he was diagnosed with cirrhosis in May 2022 when he presented with jaundice, elevated liver enzymes, ascites, severe abdominal pain. He had a CT scan, MRI liver, upper endoscopy, lab work-up, paracentesis as outlined below. He was started on Lasix and Aldactone for treatment of his ascites and lower extremity edema.  He was started on low-dose nadolol for small varices noted on EGD.  He did have an MRI showing cirrhosis of the liver with a small lesion in the liver that was indeterminant.  AFP was negative.  Follow-up MRI showed no liver lesion. ?Recall that he previously has drank alcohol very heavily, upwards of 24 beers per day plus vodka for the past several years.   ? ?He is accompanied by his wife today.  I last saw him in November.  He has been pretty stable since I last seen him.  On routine labs he was noted to have a glucose of almost 400 on her last visit, I had him see his primary care he was diagnosed with diabetes he states with an A1c in the 11's.  He was placed on metformin 500 mg twice daily and his hemoglobin has reduced now to sixes.  He states his blood sugars much better controlled. ? ?He has been can taking his Lasix 40 mg daily and Aldactone 100 mg daily, states compliance with this.  He does complain of tender, enlarging nipples since being on Aldactone.  He has not had follow-up labs with me since November.  He states despite being compliant with the regimen he still has some edema in his legs and some occasional exertional dyspnea.  His abdomen is soft, no overt ascites on exam today.  Wife states encephalopathy is well controlled on lactulose.  He is tolerating lactulose 30 g 3 times daily, moving his bowels okay, baseline is constipation 70 states lactulose helps him with his bowels at least daily. ? ?He has not been drinking any alcohol since his diagnosis and doing a good job with that.   Recall he had severe abdominal pain diffusely in his upper and lower abdomen.  He has had few imaging studies which did not show any clear cause.  He has been placed on gabapentin 300 mg daily and previously was on oxycodone 3 times daily to control his pain.  We have talked about narcotic use, he is weaning down his oxycodone to twice daily but has a hard time functioning if he is not on it.  We have discussed using lidocaine patches but has not tried it yet.  I referred him to mental health for depression our last visit, he was never contacted about an appointment, but states he is doing much better in that regard and he and wife do not think present time. ? ? ?Prior workup: ?CT abdomen pelvis scan 02/15/21: ?IMPRESSION: ?Hepatic cirrhosis, and findings of portal venous hypertension ?including esophageal varices and splenomegaly. ?Mild-to-moderate ascites. ?2 cm ill-defined low-attenuation lesion in posterior right hepatic ?lobe. Neoplasm cannot be excluded. Abdomen MRI without and with ?contrast recommended for further characterization. ?Aortic Atherosclerosis (ICD10-I70.0). ?  ?  ?MRI liver 02/16/21: ?IMPRESSION: ?1. The previously demonstrated lesion of concern inferiorly in the ?right hepatic lobe has nonspecific MR features with delayed ring ?enhancement and ill-defined T2 signal. These are not the typical ?features of hepatocellular carcinoma, although non-HCC malignancy ?such as intrahepatic cholangiocarcinoma or metastatic disease cannot ?be excluded. Recommend MR follow-up in  3 months. This lesion is ?likely too small to evaluate or biopsy by ultrasound. ?2. Morphologic changes of cirrhosis with secondary signs of portal ?hypertension. Mild-to-moderate ascites. ?3. Prominent lymph nodes in the porta hepatis, likely reactive. ?  ?  ?EGD 02/21/21:Grade II and small (< 5 mm) esophageal varices with no bleeding and no stigmata of ?recent bleeding. ?- A small amount of food (residue) in the stomach. ?- Portal  hypertensive gastropathy. ?- Normal examined duodenum. ?- No specimens collected. ?  ?Hep A / B vaccine 03/15/21, 04/17/21 ?  ?  ?MRI liver with / without contrast - 08/16/21 -  ?IMPRESSION: ?1. A previously described focus of ill-defined T2 hyperintensity in ?the inferior right lobe of the liver, hepatic segment VI, is not ?noted on current examination, nor is previously described rim ?hypoenhancement noted on multiphasic imaging. This is of uncertain ?significance and may reflect resolution of a small abscess or other ?nonspecific infection or inflammation given initial presentation of ?abdominal pain. ?2. No suspicious liver lesion or contrast enhancement at this time. ?3. Cirrhosis and splenomegaly. ?4. Trace ascites. ?  ? ?  ?Colonoscopy - 02/10/2017 - Dr. Mliss Fritz [facility] - "excellent prep", one 5-43mm polyp removed from right colon via hot snare - c/w adenoma on path, otherwise normal exam. ?  ?Based on national guidelines would recommend a repeat exam in 7 years for surveillance. ?  ?Past Medical History:  ?Diagnosis Date  ? Alcoholism ([place]) 06/06/2012  ? Anxiety   ? panic attacks  ? Ascites 02/08/2021  ? Cirrhosis ([place]) 02/08/2021  ? Colon polyps   ? Depression   ? GERD (gastroesophageal reflux disease)   ? 02/08/21  ? Hypertension   ? ? ? ?Past Surgical History:  ?Procedure Laterality Date  ? colonoscopy  12/2016  ? ESOPHAGOGASTRODUODENOSCOPY (EGD) WITH PROPOFOL N/A 02/21/2021  ? Procedure: ESOPHAGOGASTRODUODENOSCOPY (EGD) WITH PROPOFOL;  Surgeon: Mauri Pole, MD;  Location: [facility];  Service: Endoscopy;  Laterality: N/A;  ? RADIOLOGY WITH ANESTHESIA N/A 08/16/2021  ? Procedure: MRI LIVER WITH AND WITHOUT CONTRAST;  Surgeon: Radiologist, Medication, MD;  Location: [place] [facility];  Service: Radiology;  Laterality: N/A;  ? WISDOM TOOTH EXTRACTION    ? ?Family History  ?Problem Relation Age of Onset  ? Skin cancer Mother   ? Peripheral vascular disease Father   ? Stomach cancer Father   ?  Congenital heart disease Maternal Grandmother   ? Heart attack Maternal Grandmother   ? CAD Maternal Grandmother   ? Dementia Maternal Grandfather   ? CAD Maternal Grandfather   ? Heart attack Maternal Grandfather   ? Skin cancer Maternal Grandfather   ? Congenital heart disease Paternal Grandmother   ? Lung cancer Paternal Grandmother   ? Cancer Paternal Grandfather   ? COPD Paternal Grandfather   ? Colon cancer Neg Hx   ? Colon polyps Neg Hx   ? ?Social History  ? ?Tobacco Use  ? Smoking status: Former  ?  Packs/day: 3.00  ?  Years: 15.00  ?  Pack years: 45.00  ?  Types: Cigarettes  ?  Quit date: 11/24/2020  ?  Years since quitting: 1.1  ? Smokeless tobacco: Never  ?Vaping Use  ? Vaping Use: Never used  ?Substance Use Topics  ? Alcohol use: Not Currently  ?  Alcohol/week: 18.0 - 19.0 standard drinks  ?  Types: 18 - 19 Cans of beer per week  ?  Comment: 01/15/2021  ? Drug use: No  ? ?Current Outpatient Medications  ?  Medication Sig Dispense Refill  ? folic acid (FOLVITE) 1 MG tablet Take 1 tablet (1 mg total) by mouth daily.    ? furosemide (LASIX) 40 MG tablet Take 1 tablet (40 mg total) by mouth daily. 90 tablet 0  ? gabapentin (NEURONTIN) 300 MG capsule TAKE 1 CAPSULE TWICE DAILY AS NEEDED FORHICCUPS 60 capsule 0  ? lactulose (CHRONULAC) 10 GM/15ML solution TAKE 30 MLS 3 TIMES DAILY 1892 mL 3  ? metFORMIN (GLUCOPHAGE) 500 MG tablet Take by mouth 2 (two) times daily with a meal.    ? multivitamin (PROSIGHT) TABS tablet Take 1 tablet by mouth daily. 30 tablet 0  ? nadolol (CORGARD) 20 MG tablet Take 1 tablet (20 mg total) by mouth daily. Please keep your upcoming appointment for further refills. Thank you 30 tablet 0  ? oxyCODONE (OXY IR/ROXICODONE) 5 MG immediate release tablet Take 5 mg by mouth in the morning and at bedtime.    ? pantoprazole (PROTONIX) 40 MG tablet Take 1 tablet (40 mg total) by mouth daily. 90 tablet 1  ? spironolactone (ALDACTONE) 100 MG tablet Take 1 tablet (100 mg total) by mouth daily. 90  tablet 0  ? thiamine 100 MG tablet Take 1 tablet (100 mg total) by mouth daily.    ? ?No current facility-administered medications for this visit.  ? ?Allergies  ?Allergen Reactions  ? Atenolol   ?  fatigue  ? Codeine It

## 2022-01-30 LAB — CBC WITH DIFFERENTIAL/PLATELET
Basophils Absolute: 0.1 10*3/uL (ref 0.0–0.1)
Basophils Relative: 1.3 % (ref 0.0–3.0)
Eosinophils Absolute: 0.2 10*3/uL (ref 0.0–0.7)
Eosinophils Relative: 2.6 % (ref 0.0–5.0)
HCT: 39.7 % (ref 39.0–52.0)
Hemoglobin: 13.9 g/dL (ref 13.0–17.0)
Lymphocytes Relative: 25.4 % (ref 12.0–46.0)
Lymphs Abs: 1.8 10*3/uL (ref 0.7–4.0)
MCHC: 35 g/dL (ref 30.0–36.0)
MCV: 91.4 fl (ref 78.0–100.0)
Monocytes Absolute: 0.7 10*3/uL (ref 0.1–1.0)
Monocytes Relative: 9.6 % (ref 3.0–12.0)
Neutro Abs: 4.4 10*3/uL (ref 1.4–7.7)
Neutrophils Relative %: 61.1 % (ref 43.0–77.0)
Platelets: 123 10*3/uL — ABNORMAL LOW (ref 150.0–400.0)
RBC: 4.34 Mil/uL (ref 4.22–5.81)
RDW: 13.8 % (ref 11.5–15.5)
WBC: 7.3 10*3/uL (ref 4.0–10.5)

## 2022-02-11 ENCOUNTER — Other Ambulatory Visit: Payer: Self-pay | Admitting: Gastroenterology

## 2022-02-12 ENCOUNTER — Ambulatory Visit (HOSPITAL_COMMUNITY)
Admission: RE | Admit: 2022-02-12 | Discharge: 2022-02-12 | Disposition: A | Discharge status: Home / Self Care | Payer: 59 | Source: Ambulatory Visit | Attending: Gastroenterology | Admitting: Gastroenterology

## 2022-02-12 DIAGNOSIS — R600 Localized edema: Secondary | ICD-10-CM | POA: Diagnosis not present

## 2022-02-12 LAB — ECHOCARDIOGRAM COMPLETE
AR max vel: 3.54 cm2
AV Area VTI: 3.43 cm2
AV Area mean vel: 3.34 cm2
AV Mean grad: 5.7 mmHg
AV Peak grad: 11 mmHg
Ao pk vel: 1.66 m/s
Area-P 1/2: 2.17 cm2
MV M vel: 4.75 m/s
MV Peak grad: 90.3 mmHg
S' Lateral: 2.8 cm

## 2022-03-01 ENCOUNTER — Telehealth: Payer: Self-pay

## 2022-03-01 DIAGNOSIS — K7031 Alcoholic cirrhosis of liver with ascites: Secondary | ICD-10-CM

## 2022-03-01 DIAGNOSIS — K7682 Hepatic encephalopathy: Secondary | ICD-10-CM

## 2022-03-01 DIAGNOSIS — I8500 Esophageal varices without bleeding: Secondary | ICD-10-CM

## 2022-03-01 DIAGNOSIS — R109 Unspecified abdominal pain: Secondary | ICD-10-CM

## 2022-03-01 NOTE — Telephone Encounter (Signed)
MyChart message sent to patient with lab reminder. Lab order in epic. 

## 2022-03-01 NOTE — Telephone Encounter (Signed)
Received a call from [place] at [facility]. She reports that pt was recently seen at their office and reported continued lower extremity edema and LLQ pain. Albin Felling wanted to know when patient was due for [place] and follow up. I informed her that the order for the [place] has been placed and patient is due for [place] this month. Advised that it was recommended that he follow up in 6 months or sooner. Albin Felling reports that pt had labs drawn on 02/21/22. I asked Albin Felling to fax his most recent labs so that you can review them since he was due for repeat labs anyway. Not sure if diuretics will need to be adjusted. I told her that I will call her back with your recommendations. Albin Felling verbalized understanding and had no other concerns at the end of the call.   Will await fax with results.  Secure staff message sent to radiology scheduling to contact pt to set up his [place] appt.

## 2022-03-01 NOTE — Telephone Encounter (Signed)
-----   Message from Missy Sabins, RN sent at 01/30/2022  1:34 PM EDT ----- Regarding: Labs BMET - need to enter order

## 2022-03-01 NOTE — Telephone Encounter (Signed)
Patient has reviewed MyChart message. Last read by De Burrs "Richie" at  9:07 AM on 03/01/2022.

## 2022-03-01 NOTE — Telephone Encounter (Signed)
No records received. I called [place] and had to leave a vm for medical records. I asked that they fax the lab results again to my attention.

## 2022-03-04 NOTE — Telephone Encounter (Signed)
Called and spoke with [facility] regarding Dr. Doyne Keel recommendations. She states that she will discuss with patient. Colletta Maryland will call us back if patient decides to increase doses as recommended. Colletta Maryland is aware that patient will need BMET 1 week after increasing diuretics. Colletta Maryland will let us know what they decide. She verbalized understanding of all information and had no concerns at the end of the call.

## 2022-03-04 NOTE — Telephone Encounter (Signed)
Adam Hartman I got the results from his labs:  02-21-2022 BUN 14, creatinine 0.89, potassium 5.1, Na 139  Renal function is stable on his current dosing of diuretics. Can you please ensure he is on the following regimen for his diuretics:  Lasix 40mg  / day Amiloride 10mg  BID   If he is taking this regimen and still having problems with edema, he can increase Lasix to 40 mg twice daily, and increase amiloride to 20 mg twice daily.  If he does this he will need another BMET in 1 week.  He must be compliant with lab draws if he is going to increase his diuretics so we can monitor his renal functions and electrolytes closely.  Thanks

## 2022-03-04 NOTE — Telephone Encounter (Signed)
Records received. Placed in your IN box for review. Thanks

## 2022-03-14 NOTE — Telephone Encounter (Signed)
Received a call from [place] at [facility]. She wanted to follow up and discuss Dr. Lanetta Inch recommendations regarding pt's diuretics. I informed Albin Felling of his recommendations. I told her that we called the patient's wife and discussed the recommendations with her on 5/22. I told Albin Felling that I wil fax over Dr. Lanetta Inch recommendations. Albin Felling provided the fax # [phone].

## 2022-03-22 ENCOUNTER — Other Ambulatory Visit (HOSPITAL_COMMUNITY)
Admission: RE | Admit: 2022-03-22 | Discharge: 2022-03-22 | Disposition: A | Discharge status: Home / Self Care | Payer: 59 | Source: Ambulatory Visit | Attending: Gastroenterology | Admitting: Gastroenterology

## 2022-03-22 ENCOUNTER — Ambulatory Visit (HOSPITAL_COMMUNITY)
Admission: RE | Admit: 2022-03-22 | Discharge: 2022-03-22 | Disposition: A | Discharge status: Home / Self Care | Payer: 59 | Source: Ambulatory Visit | Attending: Gastroenterology | Admitting: Gastroenterology

## 2022-03-22 DIAGNOSIS — R109 Unspecified abdominal pain: Secondary | ICD-10-CM

## 2022-03-22 DIAGNOSIS — K7031 Alcoholic cirrhosis of liver with ascites: Secondary | ICD-10-CM | POA: Diagnosis present

## 2022-03-22 DIAGNOSIS — I8500 Esophageal varices without bleeding: Secondary | ICD-10-CM

## 2022-03-22 DIAGNOSIS — K7682 Hepatic encephalopathy: Secondary | ICD-10-CM | POA: Insufficient documentation

## 2022-03-22 LAB — BASIC METABOLIC PANEL
Anion gap: 6 (ref 5–15)
BUN: 10 mg/dL (ref 6–20)
CO2: 24 mmol/L (ref 22–32)
Calcium: 9.1 mg/dL (ref 8.9–10.3)
Chloride: 107 mmol/L (ref 98–111)
Creatinine, Ser: 0.7 mg/dL (ref 0.61–1.24)
GFR, Estimated: >60 mL/min (ref >60)
Glucose, Bld: 128 mg/dL — ABNORMAL HIGH (ref 70–99)
Potassium: 4.4 mmol/L (ref 3.5–5.1)
Sodium: 137 mmol/L (ref 135–145)

## 2022-03-25 ENCOUNTER — Other Ambulatory Visit: Payer: Self-pay

## 2022-03-25 DIAGNOSIS — K7031 Alcoholic cirrhosis of liver with ascites: Secondary | ICD-10-CM

## 2022-05-06 ENCOUNTER — Other Ambulatory Visit: Payer: Self-pay | Admitting: Gastroenterology

## 2022-05-06 ENCOUNTER — Encounter: Payer: Self-pay | Admitting: Gastroenterology

## 2022-05-15 ENCOUNTER — Other Ambulatory Visit: Payer: Self-pay | Admitting: Gastroenterology

## 2022-06-12 ENCOUNTER — Other Ambulatory Visit: Payer: Self-pay | Admitting: Gastroenterology

## 2022-06-25 ENCOUNTER — Telehealth: Payer: Self-pay

## 2022-06-25 NOTE — Telephone Encounter (Signed)
MyChart message sent to patient with lab reminder.  

## 2022-06-25 NOTE — Telephone Encounter (Signed)
-----   Message from Missy Sabins, RN sent at 03/25/2022  8:43 AM EDT ----- Regarding: Labs BMET - the order is in epic

## 2022-06-27 NOTE — Telephone Encounter (Signed)
Called and spoke with patient's wife. I asked that she let patient know that he is due for repeat BMET at this time. Judeth Cornfield will relay information to patient. Judeth Cornfield had no concerns at the end of the call.

## 2022-07-18 ENCOUNTER — Other Ambulatory Visit: Payer: Self-pay | Admitting: Gastroenterology

## 2022-07-24 ENCOUNTER — Other Ambulatory Visit (HOSPITAL_COMMUNITY)
Admission: RE | Admit: 2022-07-24 | Discharge: 2022-07-24 | Disposition: A | Discharge status: Home / Self Care | Payer: Self-pay | Source: Ambulatory Visit | Attending: Gastroenterology | Admitting: Gastroenterology

## 2022-07-24 DIAGNOSIS — K7031 Alcoholic cirrhosis of liver with ascites: Secondary | ICD-10-CM | POA: Insufficient documentation

## 2022-07-24 LAB — BASIC METABOLIC PANEL
Anion gap: 6 (ref 5–15)
BUN: 9 mg/dL (ref 6–20)
CO2: 23 mmol/L (ref 22–32)
Calcium: 8.7 mg/dL — ABNORMAL LOW (ref 8.9–10.3)
Chloride: 108 mmol/L (ref 98–111)
Creatinine, Ser: 0.57 mg/dL — ABNORMAL LOW (ref 0.61–1.24)
GFR, Estimated: >60 mL/min (ref >60)
Glucose, Bld: 111 mg/dL — ABNORMAL HIGH (ref 70–99)
Potassium: 3.9 mmol/L (ref 3.5–5.1)
Sodium: 137 mmol/L (ref 135–145)

## 2022-07-25 IMAGING — CT CT ABD-PELV W/ CM
2 of 5 series · 16 of 46 positions shown, 18 images · IV contrast (OMNIPAQUE 300)
Comparison: None.

CLINICAL DATA: Severe abdominal pain and distention. Alcoholic
cirrhosis.

EXAM:
CT ABDOMEN AND PELVIS WITH CONTRAST
TECHNIQUE: Multidetector CT imaging of the abdomen and pelvis was performed
using the standard protocol following bolus administration of
intravenous contrast.
CONTRAST:  100mL OMNIPAQUE IOHEXOL 300 MG/ML  SOLN

[Series 2: axial st · axial · 0.97mm/px · z∈[+1036,+1481]mm · 13 of 105 slices shown, 15 images]
[im 8/105  soft-tissue]
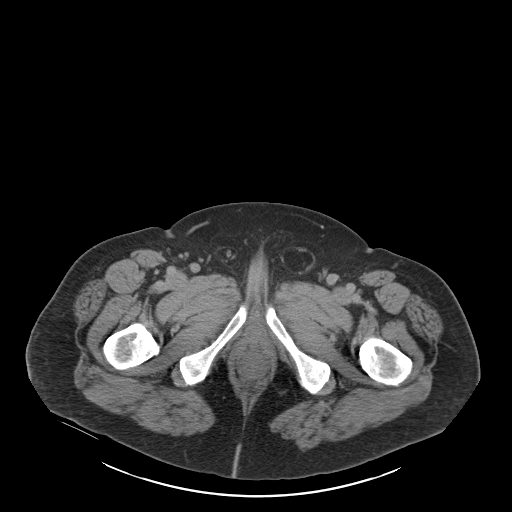
[im 8/105  bone]
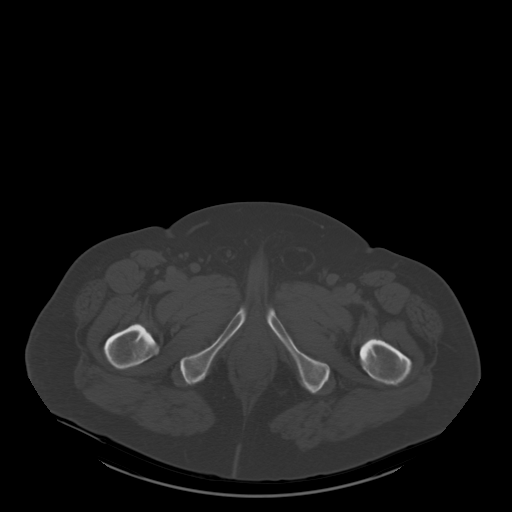
[im 15/105  soft-tissue]
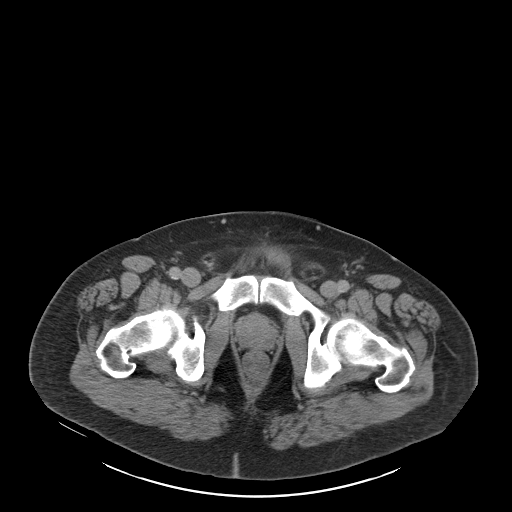
[im 23/105  soft-tissue]
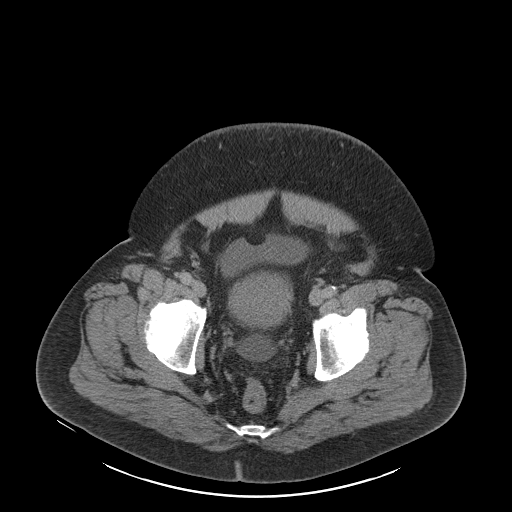
[im 30/105  soft-tissue]
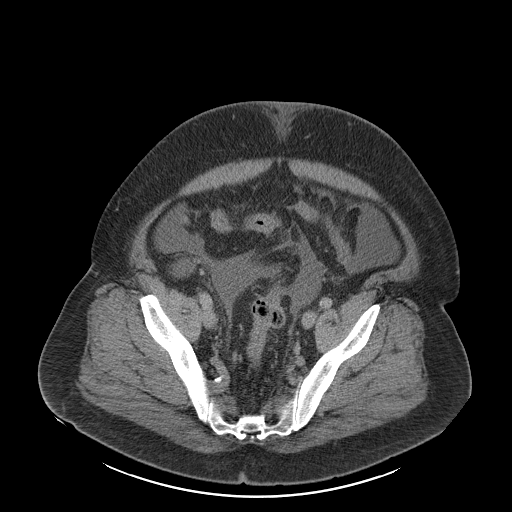
[im 38/105  soft-tissue]
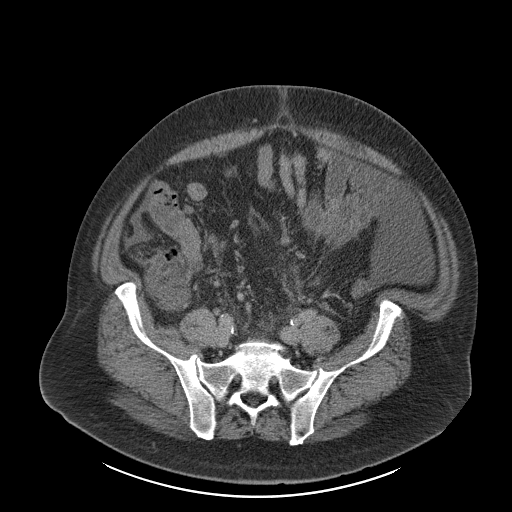
[im 45/105  soft-tissue]
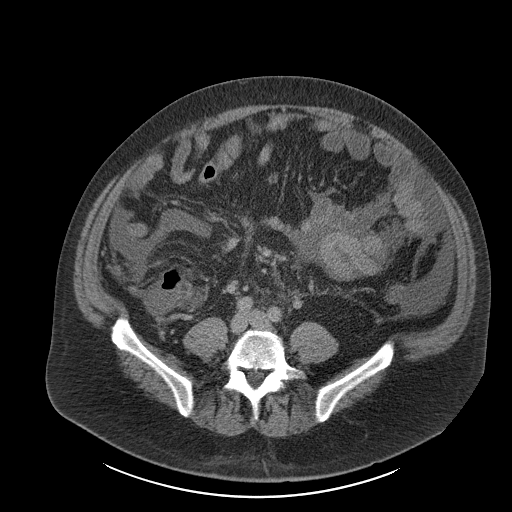
[im 53/105  soft-tissue]
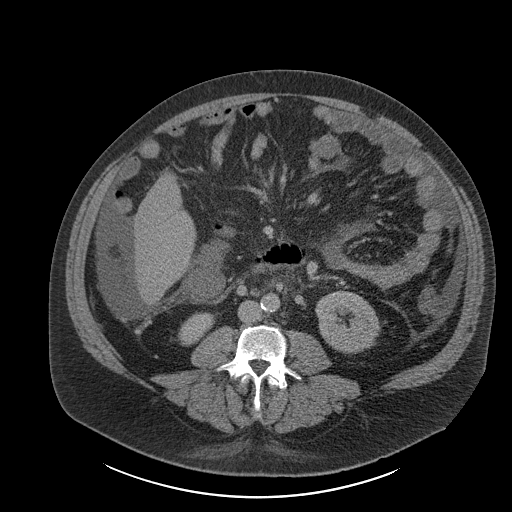
[im 60/105  soft-tissue]
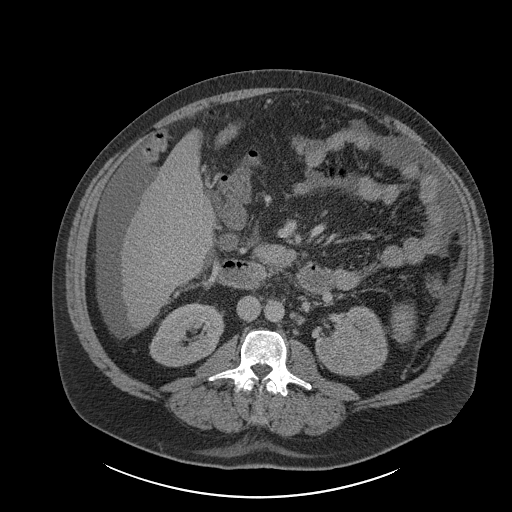
[im 67/105  soft-tissue]
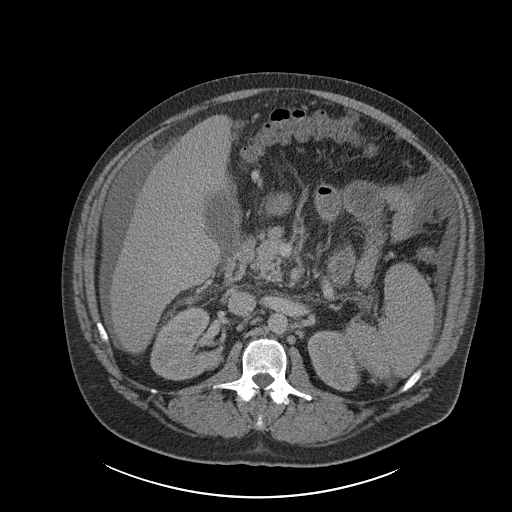
[im 67/105  bone]
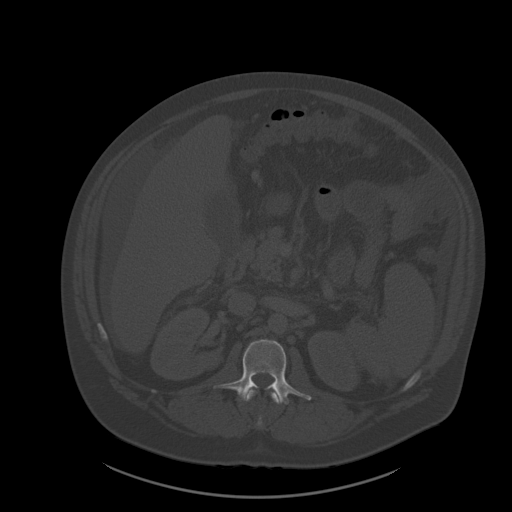
[im 75/105  soft-tissue]
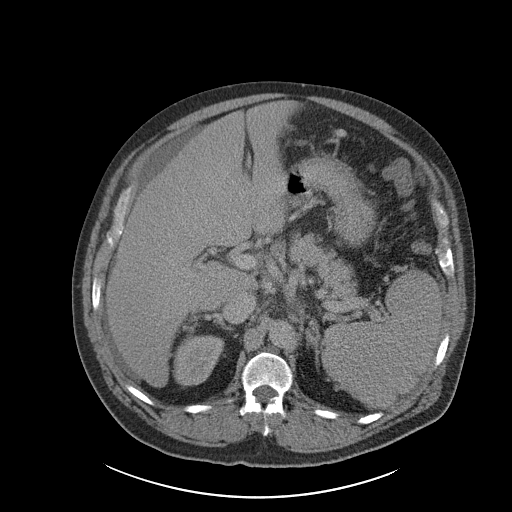
[im 82/105  soft-tissue]
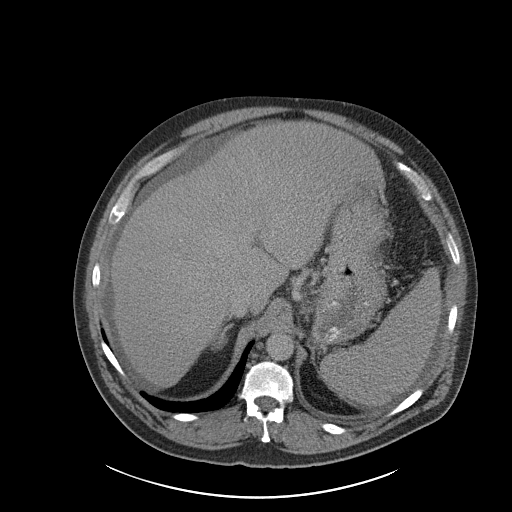
[im 90/105  soft-tissue]
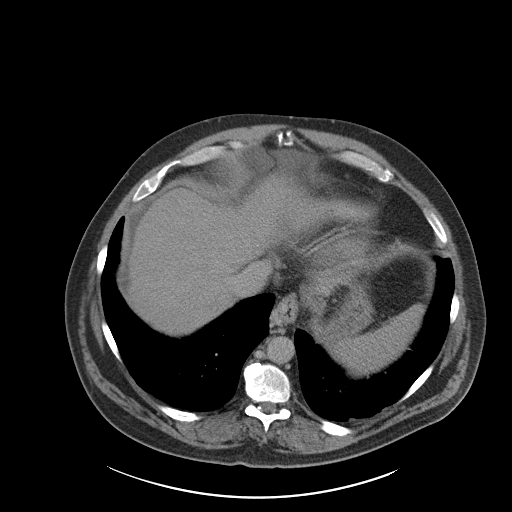
[im 97/105  soft-tissue]
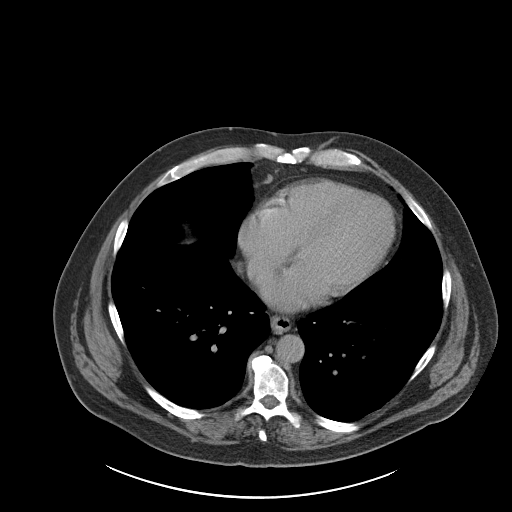

[Series 5: coronal st · coronal · 0.99mm/px · 3 of 208 slices shown]
[im 70/208  soft-tissue]
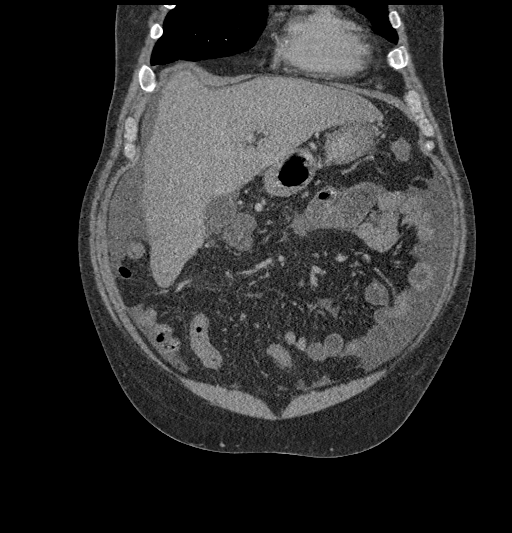
[im 93/208  soft-tissue]
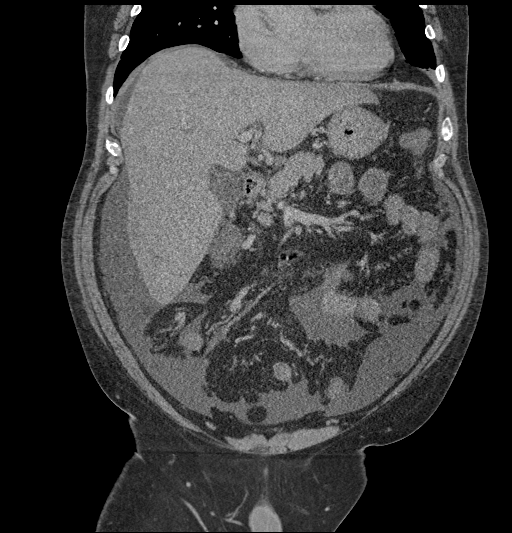
[im 116/208  soft-tissue]
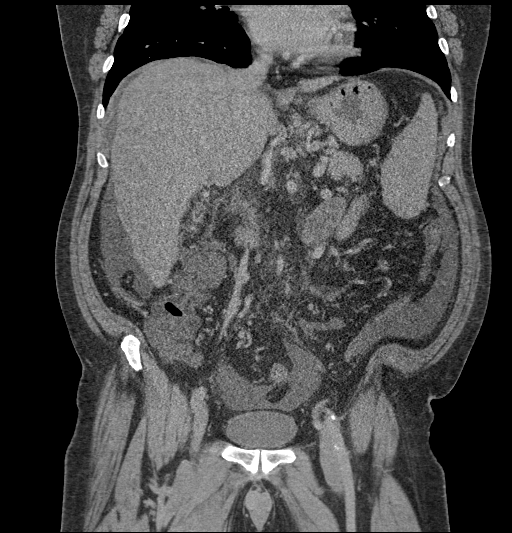

[16 of 46 positions shown; findings below may reference images not displayed]

FINDINGS: Lower Chest: No acute findings.

Hepatobiliary: Hepatic cirrhosis is demonstrated. An ill-defined
low-attenuation lesion is seen in the posterior right hepatic lobe
measuring approximately 2 cm on image 43/2. A tiny cyst is seen in
the anterior liver dome. Recanalization of paraumbilical veins is
consistent with portal venous hypertension. Gallbladder is
unremarkable. No evidence of biliary ductal dilatation.

Pancreas:  No mass or inflammatory changes.

Spleen: Moderate splenomegaly with length measuring 15-16 cm. No
masses identified.

Adrenals/Urinary Tract: No masses identified. No evidence of
ureteral calculi or hydronephrosis.

Stomach/Bowel: No evidence of obstruction, inflammatory process or
abnormal fluid collections.

Vascular/Lymphatic: No pathologically enlarged lymph nodes. No acute
vascular findings. Aortic atherosclerotic calcification noted.
Abdominal portosystemic venous collaterals are seen within the
abdomen including esophageal varices, consistent with portal venous
hypertension.

Reproductive:  No mass or other significant abnormality.

Other:  Mild-to-moderate ascites seen within the abdomen pelvis.

Musculoskeletal:  No suspicious bone lesions identified.
IMPRESSION: Hepatic cirrhosis, and findings of portal venous hypertension
including esophageal varices and splenomegaly.

Mild-to-moderate ascites.

2 cm ill-defined low-attenuation lesion in posterior right hepatic
lobe. Neoplasm cannot be excluded. Abdomen MRI without and with
contrast recommended for further characterization.

Aortic Atherosclerosis ([id number]).

## 2022-08-26 ENCOUNTER — Telehealth: Payer: Self-pay

## 2022-08-26 NOTE — Telephone Encounter (Signed)
-----   Message from Missy Sabins, RN sent at 07/25/2022 11:10 AM EDT ----- Regarding: Follow up Follow up with Dr. Adela Lank - cirrhosis of the liver

## 2022-08-26 NOTE — Telephone Encounter (Signed)
Patient has been scheduled for a follow up appointment with Dr. Adela Lank on Tuesday, 10/22/22 at 3 pm. Appt information mailed and sent to patient via MyChart.

## 2022-09-13 ENCOUNTER — Other Ambulatory Visit: Payer: Self-pay | Admitting: Gastroenterology

## 2022-09-24 ENCOUNTER — Telehealth: Payer: Self-pay

## 2022-09-24 DIAGNOSIS — K7031 Alcoholic cirrhosis of liver with ascites: Secondary | ICD-10-CM

## 2022-09-24 NOTE — Telephone Encounter (Signed)
Patient reviewed MyChart message. Last read by De Burrs "Richie" at 12:12 PM on 09/24/2022.

## 2022-09-24 NOTE — Telephone Encounter (Signed)
RUQ [place] order in epic. Secure staff message sent to radiology scheduling to contact patient to set up appt.   MyChart message sent to patient with [place] reminder.

## 2022-09-24 NOTE — Telephone Encounter (Signed)
-----   Message from Missy Sabins, RN sent at 03/25/2022  1:33 PM EDT ----- Regarding: [place] RUQ [place]- cirrhosis, HCC screening Need to enter order

## 2022-10-01 ENCOUNTER — Other Ambulatory Visit: Payer: Self-pay | Admitting: Gastroenterology

## 2022-10-21 ENCOUNTER — Ambulatory Visit (HOSPITAL_COMMUNITY)
Admission: RE | Admit: 2022-10-21 | Discharge: 2022-10-21 | Disposition: A | Discharge status: Home / Self Care | Payer: Self-pay | Source: Ambulatory Visit | Attending: Gastroenterology | Admitting: Gastroenterology

## 2022-10-21 DIAGNOSIS — K7031 Alcoholic cirrhosis of liver with ascites: Secondary | ICD-10-CM | POA: Insufficient documentation

## 2022-10-22 ENCOUNTER — Encounter: Payer: Self-pay | Admitting: Gastroenterology

## 2022-10-22 ENCOUNTER — Ambulatory Visit (INDEPENDENT_AMBULATORY_CARE_PROVIDER_SITE_OTHER): Payer: Self-pay | Admitting: Gastroenterology

## 2022-10-22 ENCOUNTER — Other Ambulatory Visit (INDEPENDENT_AMBULATORY_CARE_PROVIDER_SITE_OTHER): Payer: Self-pay

## 2022-10-22 VITALS — BP 140/82 | HR 66 | Ht 71.0 in | Wt 267.0 lb

## 2022-10-22 DIAGNOSIS — K7031 Alcoholic cirrhosis of liver with ascites: Secondary | ICD-10-CM

## 2022-10-22 DIAGNOSIS — K7682 Hepatic encephalopathy: Secondary | ICD-10-CM

## 2022-10-22 DIAGNOSIS — I8500 Esophageal varices without bleeding: Secondary | ICD-10-CM

## 2022-10-22 LAB — CBC WITH DIFFERENTIAL/PLATELET
Basophils Absolute: 0.1 10*3/uL (ref 0.0–0.1)
Basophils Relative: 0.9 % (ref 0.0–3.0)
Eosinophils Absolute: 0.1 10*3/uL (ref 0.0–0.7)
Eosinophils Relative: 1.9 % (ref 0.0–5.0)
HCT: 45.9 % (ref 39.0–52.0)
Hemoglobin: 15.7 g/dL (ref 13.0–17.0)
Lymphocytes Relative: 26.6 % (ref 12.0–46.0)
Lymphs Abs: 2 10*3/uL (ref 0.7–4.0)
MCHC: 34.2 g/dL (ref 30.0–36.0)
MCV: 89.3 fl (ref 78.0–100.0)
Monocytes Absolute: 0.9 10*3/uL (ref 0.1–1.0)
Monocytes Relative: 11.8 % (ref 3.0–12.0)
Neutro Abs: 4.3 10*3/uL (ref 1.4–7.7)
Neutrophils Relative %: 58.8 % (ref 43.0–77.0)
Platelets: 128 10*3/uL — ABNORMAL LOW (ref 150.0–400.0)
RBC: 5.13 Mil/uL (ref 4.22–5.81)
RDW: 15.2 % (ref 11.5–15.5)
WBC: 7.4 10*3/uL (ref 4.0–10.5)

## 2022-10-22 LAB — PROTIME-INR
INR: 1.4 ratio — ABNORMAL HIGH (ref 0.8–1.0)
Prothrombin Time: 15.4 s — ABNORMAL HIGH (ref 9.6–13.1)

## 2022-10-22 LAB — COMPREHENSIVE METABOLIC PANEL
ALT: 18 U/L (ref 0–53)
AST: 31 U/L (ref 0–37)
Albumin: 4.2 g/dL (ref 3.5–5.2)
Alkaline Phosphatase: 108 U/L (ref 39–117)
BUN: 10 mg/dL (ref 6–23)
CO2: 24 mEq/L (ref 19–32)
Calcium: 9.2 mg/dL (ref 8.4–10.5)
Chloride: 105 mEq/L (ref 96–112)
Creatinine, Ser: 0.88 mg/dL (ref 0.40–1.50)
GFR: 96.18 mL/min (ref >60.00)
Glucose, Bld: 106 mg/dL — ABNORMAL HIGH (ref 70–99)
Potassium: 4.1 mEq/L (ref 3.5–5.1)
Sodium: 139 mEq/L (ref 135–145)
Total Bilirubin: 1.2 mg/dL (ref 0.2–1.2)
Total Protein: 7.5 g/dL (ref 6.0–8.3)

## 2022-10-22 NOTE — Progress Notes (Signed)
HPI :  57 year old male here for a follow-up visit for alcohol related cirrhosis.    Cirrhosis history: Dx May 2022 when he presented with jaundice, elevated liver enzymes, ascites. Had extensive imaging, lab workup. He was started on Lasix and Aldactone for treatment of his ascites and lower extremity edema, low-dose nadolol for small varices noted on EGD.  Lactulose for HE. Recall that he previously has drank alcohol very heavily, upwards of 24 beers per day plus vodka for the past several years.  He has not drank any alcohol since his diagnosis.  His course has been complicated by development of diabetes.  Since his last visit he has been doing pretty well.  Recall when he was on Aldactone he developed gynecomastia.  We stopped that and switched him to amiloride 10 mg twice daily and Lasix 40 mg daily.  He states he is tolerating that pretty well and denies any problems with swelling in his legs or his abdomen.  He takes lactulose twice daily, in fact states that really helps his constipation but his encephalopathy is well-controlled.  He is accompanied by his wife today.  He tolerates nadolol and takes that every day as well.  He had an echocardiogram for some dyspnea he endorsed at his last visit, that echo looked good without any concerning findings.  He just had right upper quadrant ultrasound for HCC screening yesterday which looks good.  No concerning mass lesions.  He is overdue for labs and is agreeable to have those done today.  He states his diabetes is pretty well-controlled and his A1c is normal.  He is on metformin for that.  His main complaint is peripheral neuropathy from his diabetes.  He is on gabapentin 300 mg daily.  He follows with Dr. Leandrew Koyanagi for his diabetes.  He does have a history of reflux.  Prescribed Protonix 40 mg but only uses it as needed perhaps once every 3 weeks.  General he does not have any symptoms that bother him in this regard on a routine basis anymore.    Prior workup: CT abdomen pelvis scan 02/15/21: IMPRESSION: Hepatic cirrhosis, and findings of portal venous hypertension including esophageal varices and splenomegaly. Mild-to-moderate ascites. 2 cm ill-defined low-attenuation lesion in posterior right hepatic lobe. Neoplasm cannot be excluded. Abdomen MRI without and with contrast recommended for further characterization. Aortic Atherosclerosis (ICD10-I70.0).     MRI liver 02/16/21: IMPRESSION: 1. The previously demonstrated lesion of concern inferiorly in the right hepatic lobe has nonspecific MR features with delayed ring enhancement and ill-defined T2 signal. These are not the typical features of hepatocellular carcinoma, although non-HCC malignancy such as intrahepatic cholangiocarcinoma or metastatic disease cannot be excluded. Recommend MR follow-up in 3 months. This lesion is likely too small to evaluate or biopsy by ultrasound. 2. Morphologic changes of cirrhosis with secondary signs of portal hypertension. Mild-to-moderate ascites. 3. Prominent lymph nodes in the porta hepatis, likely reactive.     EGD 02/21/21:Grade II and small (< 5 mm) esophageal varices with no bleeding and no stigmata of recent bleeding. - A small amount of food (residue) in the stomach. - Portal hypertensive gastropathy. - Normal examined duodenum. - No specimens collected.   Hep A / B vaccine 03/15/21, 04/17/21    MRI liver with / without contrast - 08/16/21 -  IMPRESSION: 1. A previously described focus of ill-defined T2 hyperintensity in the inferior right lobe of the liver, hepatic segment VI, is not noted on current examination, nor is previously described rim hypoenhancement  noted on multiphasic imaging. This is of uncertain significance and may reflect resolution of a small abscess or other nonspecific infection or inflammation given initial presentation of abdominal pain. 2. No suspicious liver lesion or contrast enhancement at this  time. 3. Cirrhosis and splenomegaly. 4. Trace ascites.     Colonoscopy - 02/10/2017 - Dr. Mliss [facility] - "excellent prep", one 5-10mm polyp removed from right colon via hot snare - c/w adenoma on path, otherwise normal exam.   Based on national guidelines would recommend a repeat exam in 7 years for surveillance.   Echo 02/12/2022: EF 65-70%, mild LVH  RUQ [place] 18/24: IMPRESSION: 1. The liver demonstrates cirrhotic morphology. No liver mass identified. No other abnormalities.    Past Medical History:  Diagnosis Date   Alcoholism ([place]) 06/06/2012   Anxiety    panic attacks   Ascites 02/08/2021   Cirrhosis ([place]) 02/08/2021   Colon polyps    Depression    GERD (gastroesophageal reflux disease)    02/08/21   Hypertension      Past Surgical History:  Procedure Laterality Date   colonoscopy  12/2016   ESOPHAGOGASTRODUODENOSCOPY (EGD) WITH PROPOFOL N/A 02/21/2021   Procedure: ESOPHAGOGASTRODUODENOSCOPY (EGD) WITH PROPOFOL;  Surgeon: Mauri Pole, MD;  Location: [facility];  Service: Endoscopy;  Laterality: N/A;   RADIOLOGY WITH ANESTHESIA N/A 08/16/2021   Procedure: MRI LIVER WITH AND WITHOUT CONTRAST;  Surgeon: Radiologist, Medication, MD;  Location: [facility];  Service: Radiology;  Laterality: N/A;   WISDOM TOOTH EXTRACTION     Family History  Problem Relation Age of Onset   Skin cancer Mother    Peripheral vascular disease Father    Stomach cancer Father    Congenital heart disease Maternal Grandmother    Heart attack Maternal Grandmother    CAD Maternal Grandmother    Dementia Maternal Grandfather    CAD Maternal Grandfather    Heart attack Maternal Grandfather    Skin cancer Maternal Grandfather    Congenital heart disease Paternal Grandmother    Lung cancer Paternal Grandmother    Cancer Paternal Grandfather    COPD Paternal Grandfather    Colon cancer Neg Hx    Colon polyps Neg Hx    Social History   Tobacco Use   Smoking status:  Former    Packs/day: 3.00    Years: 15.00    Total pack years: 45.00    Types: Cigarettes    Quit date: 11/24/2020    Years since quitting: 1.9   Smokeless tobacco: Never  Vaping Use   Vaping Use: Never used  Substance Use Topics   Alcohol use: Not Currently    Alcohol/week: 18.0 - 19.0 standard drinks of alcohol    Types: 18 - 19 Cans of beer per week    Comment: 01/15/2021   Drug use: No   Current Outpatient Medications  Medication Sig Dispense Refill   aMILoride (MIDAMOR) 5 MG tablet Take 2 tablets (10 mg total) by mouth 2 (two) times daily. Please keep your January appointment for further refills. 315 tablet 1   folic acid (FOLVITE) 1 MG tablet Take 1 tablet (1 mg total) by mouth daily.     furosemide (LASIX) 40 MG tablet Take 1 tablet (40 mg total) by mouth daily. 90 tablet 0   gabapentin (NEURONTIN) 300 MG capsule Take 1 capsule (300 mg total) by mouth as needed. Please keep your January appointment for further refills. 60 capsule 0   lactulose (CHRONULAC) 10 GM/15ML solution Take  45 mLs (30 g total) by mouth 3 (three) times daily. 12150 mL 2   metFORMIN (GLUCOPHAGE) 500 MG tablet Take by mouth 2 (two) times daily with a meal.     multivitamin (PROSIGHT) TABS tablet Take 1 tablet by mouth daily. 30 tablet 0   nadolol (CORGARD) 20 MG tablet TAKE ONE (1) TABLET BY MOUTH EVERY DAY 90 tablet 1   oxyCODONE (OXY IR/ROXICODONE) 5 MG immediate release tablet Take 5 mg by mouth in the morning and at bedtime.     pantoprazole (PROTONIX) 40 MG tablet Take 1 tablet (40 mg total) by mouth daily. 90 tablet 1   thiamine 100 MG tablet Take 1 tablet (100 mg total) by mouth daily.     No current facility-administered medications for this visit.   Allergies  Allergen Reactions   Atenolol     fatigue   Codeine Itching   Wellbutrin [Bupropion]     Made him smoke more     Review of Systems: All systems reviewed and negative except where noted in HPI.    US ABDOMEN LIMITED RUQ  (LIVER/GB)  Result Date: 10/21/2022 CLINICAL DATA:  Cirrhosis follow-up. EXAM: ULTRASOUND ABDOMEN LIMITED RIGHT UPPER QUADRANT COMPARISON:  Abdominal ultrasound March 22, 2022 FINDINGS: Gallbladder: No gallstones or wall thickening visualized. No sonographic Murphy sign noted by sonographer. Common bile duct: Diameter: 4 mm Liver: Increased coarsened echotexture. Nodular contour. No mass identified. Portal vein is patent on color Doppler imaging with normal direction of blood flow towards the liver. Other: None. IMPRESSION: 1. The liver demonstrates cirrhotic morphology. No liver mass identified. No other abnormalities. Electronically Signed   By: Gerome Sam III M.D.   On: 10/21/2022 11:00    Lab Results  Component Value Date   WBC 7.3 01/29/2022   HGB 13.9 01/29/2022   HCT 39.7 01/29/2022   MCV 91.4 01/29/2022   PLT 123.0 (L) 01/29/2022   Lab Results  Component Value Date   CREATININE 0.57 (L) 07/24/2022   BUN 9 07/24/2022   NA 137 07/24/2022   K 3.9 07/24/2022   CL 108 07/24/2022   CO2 23 07/24/2022    Lab Results  Component Value Date   ALT 37 01/29/2022   AST 56 (H) 01/29/2022   ALKPHOS 131 (H) 01/29/2022   BILITOT 2.1 (H) 01/29/2022   Lab Results  Component Value Date   INR 1.3 (H) 11/22/2021   INR 1.4 (H) 06/05/2021   INR 1.5 (H) 02/21/2021   Computed MELD 3.0 unavailable. Necessary lab results were not found in the last year. Computed MELD-Na unavailable. Necessary lab results were not found in the last year.    Physical Exam: BP (!) 140/82   Pulse 66   Ht 5\' 11"  (1.803 m)   Wt 267 lb (121.1 kg)   BMI 37.24 kg/m  Constitutional: Pleasant,well-developed, male in no acute distress. Abdominal: Soft, nondistended, nontender. There are no masses palpable.  Extremities: no edema Neurological: Alert and oriented to person place and time. Skin: Skin is warm and dry. No rashes noted. Psychiatric: Normal mood and affect. Behavior is normal.   ASSESSMENT: 57 y.o.  male here for assessment of the following  1. Alcoholic cirrhosis of liver with ascites (HCC)   2. Esophageal varices without bleeding, unspecified esophageal varices type (HCC)   3. Encephalopathy, hepatic (HCC)    As above, has done very well with alcohol abstinence and medical regimen to treat his decompensations.  His edema and ascites is well-controlled on amiloride and Lasix  at this time, gynecomastia has resolved from spironolactone.  He is on nadolol for history of small varices and does not warrant any further surveillance EGD if he continues that.  His heart rate is in the 60s.  Lactulose controlling hepatic encephalopathy.  His HCC screening with ultrasound was up-to-date, due for AFP.  Also due for yearly blood work, will go to the lab today for that.  We discussed his cirrhosis in general, risks for further decompensation and HCC over time.  He understands this.  Alcohol abstinence is critical and he is doing a good job with that we will continue to do so based on our discussion today.  I will continue to see him every 6 months.  He will contact me in the interim with any questions or concerns.  He will follow-up with Dr. Leandrew Koyanagi for his diabetes and peripheral neuropathy, can consider increasing his gabapentin dosing.  Of note, due for surveillance colonoscopy next year for history of polyps.  PLAN: - complete alcohol abstinence - lab today for CBC, CMET, INR, AFP - recall RUQ [place] in 6 months - continue nadolol - continue lasix / amiloride - continue lactulose - f/u 6 months - recall colonoscopy in 01/2024  Harlin Rain, MD [facility]

## 2022-10-22 NOTE — Patient Instructions (Signed)
If you are age 57 or older, your body mass index should be between 23-30. Your Body mass index is 37.24 kg/m. If this is out of the aforementioned range listed, please consider follow up with your Primary Care Provider.  If you are age 21 or younger, your body mass index should be between 19-25. Your Body mass index is 37.24 kg/m. If this is out of the aformentioned range listed, please consider follow up with your Primary Care Provider.   ________________________________________________________   Please go to the lab in the basement of our building to have lab work done as you leave today. Hit "B" for basement when you get on the elevator.  When the doors open the lab is on your left.  We will call you with the results. Thank you.  You will be due for a liver ultrasound in 6 months (July). We will remind you when it is time to schedule.  You will be due for a recall colonoscopy in 01-2024. We will send you a reminder in the mail when it gets closer to that time.  Thank you for entrusting me with your care and for choosing [facility], Dr. Weiser Cellar

## 2022-10-24 LAB — AFP TUMOR MARKER: AFP-Tumor Marker: 2.8 ng/mL (ref <6.1)

## 2022-11-01 ENCOUNTER — Other Ambulatory Visit: Payer: Self-pay | Admitting: Gastroenterology

## 2022-11-01 NOTE — Telephone Encounter (Signed)
Okay to refill thanks

## 2022-11-01 NOTE — Telephone Encounter (Signed)
May I refill Sir? Thank you. 

## 2022-11-05 MED ORDER — GABAPENTIN 300 MG PO CAPS: ORAL_CAPSULE | ORAL | 1 refills | Status: DC

## 2022-11-05 NOTE — Addendum Note (Signed)
Addended by: [place], Kaidynce Pfister E on: 11/05/2022 10:15 AM   Modules accepted: Orders

## 2022-11-05 NOTE — Telephone Encounter (Signed)
Per Dr Havery Moros okay to refill gabapentin 3 months plus 1 refill. I noted that he should get future refills from PCP per Dr Havery Moros.

## 2023-02-15 ENCOUNTER — Other Ambulatory Visit: Payer: Self-pay | Admitting: Gastroenterology

## 2023-03-07 ENCOUNTER — Other Ambulatory Visit: Payer: Self-pay | Admitting: Gastroenterology

## 2023-04-09 ENCOUNTER — Telehealth: Payer: Self-pay

## 2023-04-09 DIAGNOSIS — K7682 Hepatic encephalopathy: Secondary | ICD-10-CM

## 2023-04-09 DIAGNOSIS — I8500 Esophageal varices without bleeding: Secondary | ICD-10-CM

## 2023-04-09 DIAGNOSIS — K7031 Alcoholic cirrhosis of liver with ascites: Secondary | ICD-10-CM

## 2023-04-09 DIAGNOSIS — R932 Abnormal findings on diagnostic imaging of liver and biliary tract: Secondary | ICD-10-CM

## 2023-04-09 NOTE — Telephone Encounter (Signed)
MyChart message sent to patient.  Message to scheduling to call patient to schedule RUQ

## 2023-04-09 NOTE — Telephone Encounter (Signed)
-----   Message from Cooper Render, CMA sent at 10/22/2022  3:24 PM EST ----- Regarding: ruq u/s Due for RUQ U/S in July for cirrhosis

## 2023-04-16 ENCOUNTER — Other Ambulatory Visit: Payer: Self-pay | Admitting: Gastroenterology

## 2023-06-23 ENCOUNTER — Ambulatory Visit (HOSPITAL_COMMUNITY)
Admission: RE | Admit: 2023-06-23 | Discharge: 2023-06-23 | Disposition: A | Discharge status: Home / Self Care | Payer: Self-pay | Source: Ambulatory Visit | Attending: Gastroenterology | Admitting: Gastroenterology

## 2023-06-23 DIAGNOSIS — R932 Abnormal findings on diagnostic imaging of liver and biliary tract: Secondary | ICD-10-CM | POA: Insufficient documentation

## 2023-06-23 DIAGNOSIS — K7682 Hepatic encephalopathy: Secondary | ICD-10-CM | POA: Insufficient documentation

## 2023-06-23 DIAGNOSIS — K7031 Alcoholic cirrhosis of liver with ascites: Secondary | ICD-10-CM | POA: Insufficient documentation

## 2023-06-23 DIAGNOSIS — I8500 Esophageal varices without bleeding: Secondary | ICD-10-CM | POA: Insufficient documentation

## 2023-06-26 ENCOUNTER — Encounter: Payer: Self-pay | Admitting: Gastroenterology

## 2023-06-26 ENCOUNTER — Other Ambulatory Visit (INDEPENDENT_AMBULATORY_CARE_PROVIDER_SITE_OTHER): Payer: Self-pay

## 2023-06-26 ENCOUNTER — Ambulatory Visit (INDEPENDENT_AMBULATORY_CARE_PROVIDER_SITE_OTHER): Payer: Self-pay | Admitting: Gastroenterology

## 2023-06-26 VITALS — BP 122/70 | HR 58 | Ht 71.0 in | Wt 254.0 lb

## 2023-06-26 DIAGNOSIS — I8500 Esophageal varices without bleeding: Secondary | ICD-10-CM

## 2023-06-26 DIAGNOSIS — K7031 Alcoholic cirrhosis of liver with ascites: Secondary | ICD-10-CM

## 2023-06-26 DIAGNOSIS — K7682 Hepatic encephalopathy: Secondary | ICD-10-CM

## 2023-06-26 DIAGNOSIS — I8510 Secondary esophageal varices without bleeding: Secondary | ICD-10-CM

## 2023-06-26 LAB — COMPREHENSIVE METABOLIC PANEL
ALT: 44 U/L (ref 0–53)
AST: 87 U/L — ABNORMAL HIGH (ref 0–37)
Albumin: 3.5 g/dL (ref 3.5–5.2)
Alkaline Phosphatase: 127 U/L — ABNORMAL HIGH (ref 39–117)
BUN: 9 mg/dL (ref 6–23)
CO2: 26 meq/L (ref 19–32)
Calcium: 8.7 mg/dL (ref 8.4–10.5)
Chloride: 103 meq/L (ref 96–112)
Creatinine, Ser: 0.71 mg/dL (ref 0.40–1.50)
GFR: 102.14 mL/min (ref >60.00)
Glucose, Bld: 98 mg/dL (ref 70–99)
Potassium: 4.2 meq/L (ref 3.5–5.1)
Sodium: 138 meq/L (ref 135–145)
Total Bilirubin: 4.9 mg/dL — ABNORMAL HIGH (ref 0.2–1.2)
Total Protein: 7.1 g/dL (ref 6.0–8.3)

## 2023-06-26 LAB — PROTIME-INR
INR: 1.6 ratio — ABNORMAL HIGH (ref 0.8–1.0)
Prothrombin Time: 16.6 s — ABNORMAL HIGH (ref 9.6–13.1)

## 2023-06-26 LAB — CBC WITH DIFFERENTIAL/PLATELET
Basophils Absolute: 0 10*3/uL (ref 0.0–0.1)
Basophils Relative: 0.9 % (ref 0.0–3.0)
Eosinophils Absolute: 0.1 10*3/uL (ref 0.0–0.7)
Eosinophils Relative: 1.9 % (ref 0.0–5.0)
HCT: 45 % (ref 39.0–52.0)
Hemoglobin: 15 g/dL (ref 13.0–17.0)
Lymphocytes Relative: 21.8 % (ref 12.0–46.0)
Lymphs Abs: 1.1 10*3/uL (ref 0.7–4.0)
MCHC: 33.4 g/dL (ref 30.0–36.0)
MCV: 99.6 fl (ref 78.0–100.0)
Monocytes Absolute: 0.6 10*3/uL (ref 0.1–1.0)
Monocytes Relative: 11.6 % (ref 3.0–12.0)
Neutro Abs: 3.2 10*3/uL (ref 1.4–7.7)
Neutrophils Relative %: 63.8 % (ref 43.0–77.0)
Platelets: 105 10*3/uL — ABNORMAL LOW (ref 150.0–400.0)
RBC: 4.52 Mil/uL (ref 4.22–5.81)
RDW: 15.8 % — ABNORMAL HIGH (ref 11.5–15.5)
WBC: 5 10*3/uL (ref 4.0–10.5)

## 2023-06-26 MED ORDER — LACTULOSE 10 GM/15ML PO SOLN: 30.0000 g | Freq: Three times a day (TID) | ORAL | 1 refills | Status: DC

## 2023-06-26 MED ORDER — FUROSEMIDE 40 MG PO TABS: 40.0000 mg | ORAL_TABLET | Freq: Every day | ORAL | 1 refills | Status: DC

## 2023-06-26 MED ORDER — NADOLOL 20 MG PO TABS: 20.0000 mg | ORAL_TABLET | Freq: Every day | ORAL | 1 refills | Status: DC

## 2023-06-26 NOTE — Progress Notes (Signed)
HPI :  57 year old male here for a follow-up visit for alcohol related cirrhosis.  He is accompanied by his mother today.   Cirrhosis history: Dx May 2022 when he presented with jaundice, elevated liver enzymes, ascites. Had extensive imaging, lab workup. He was started on Lasix and Aldactone for treatment of his ascites and lower extremity edema, low-dose nadolol for small varices noted on EGD.  Lactulose for HE. Recall that he previously has drank alcohol very heavily, upwards of 24 beers per day plus vodka for the past several years.  He has not drank any alcohol since his diagnosis.  His course has been complicated by development of diabetes. Recall when he was on Aldactone he developed gynecomastia.  We stopped that and switched him to amiloride   SINCE LAST VISIT:  I last saw him in January 2024 at that time he was pretty stable on his medication regimen we did not make any changes at the last visit.  Unfortunately has not been doing so well since of last seen him.  He unfortunately had a divorce from his wife which was very difficult for him.  He had a hard time coping and started drinking again this past May.  He states he relapsed for the past 3 months or so, was drinking about 6 beers per day.  He stopped about 2 weeks ago.  During this time he had worsening lower extremity edema and he thinks some ascites.  He continued to take all of his medications.  Denies any jaundice.  No GI bleeding, no problems with encephalopathy.  He has been seen by his primary care recently who put him on some Prozac to help him deal with some of these emotional issues.  Apparently he was also seen by hematologist in recent months for thrombocytopenia related to his cirrhosis.  He states he is feeling a bit better over the past few weeks since he stopped drinking alcohol.  He thinks his edema is improving.  He is compliant with his lactulose and denies any problems with his mentation.  Also taking his nadolol  as prescribed.  He had a right upper quadrant ultrasound on September 9 which looked stable.  No obvious ascites noted.  No mass lesions.  He was seen by his primary care recently and they asked if we can check his hemoglobin A1c as he is due for the rest of his lab work today. He follows with Dr. Leandrew Koyanagi for his diabetes.   He does have a history of reflux.  Prescribed Protonix 40 mg but only uses it as needed perhaps once every 3 weeks.  General he does not have any symptoms that bother him in this regard on a routine basis anymore.   Prior workup: CT abdomen pelvis scan 02/15/21: IMPRESSION: Hepatic cirrhosis, and findings of portal venous hypertension including esophageal varices and splenomegaly. Mild-to-moderate ascites. 2 cm ill-defined low-attenuation lesion in posterior right hepatic lobe. Neoplasm cannot be excluded. Abdomen MRI without and with contrast recommended for further characterization. Aortic Atherosclerosis (ICD10-I70.0).     MRI liver 02/16/21: IMPRESSION: 1. The previously demonstrated lesion of concern inferiorly in the right hepatic lobe has nonspecific MR features with delayed ring enhancement and ill-defined T2 signal. These are not the typical features of hepatocellular carcinoma, although non-HCC malignancy such as intrahepatic cholangiocarcinoma or metastatic disease cannot be excluded. Recommend MR follow-up in 3 months. This lesion is likely too small to evaluate or biopsy by ultrasound. 2. Morphologic changes of cirrhosis with secondary signs  of portal hypertension. Mild-to-moderate ascites. 3. Prominent lymph nodes in the porta hepatis, likely reactive.     EGD 02/21/21:Grade II and small (< 5 mm) esophageal varices with no bleeding and no stigmata of recent bleeding. - A small amount of food (residue) in the stomach. - Portal hypertensive gastropathy. - Normal examined duodenum. - No specimens collected.   Hep A / B vaccine 03/15/21, 04/17/21   MRI  liver with / without contrast - 08/16/21 -  IMPRESSION: 1. A previously described focus of ill-defined T2 hyperintensity in the inferior right lobe of the liver, hepatic segment VI, is not noted on current examination, nor is previously described rim hypoenhancement noted on multiphasic imaging. This is of uncertain significance and may reflect resolution of a small abscess or other nonspecific infection or inflammation given initial presentation of abdominal pain. 2. No suspicious liver lesion or contrast enhancement at this time. 3. Cirrhosis and splenomegaly. 4. Trace ascites.     Colonoscopy - 02/10/2017 - Dr. Tiburcio Bash [facility] - "excellent prep", one 5-24mm polyp removed from right colon via hot snare - c/w adenoma on path, otherwise normal exam.   Based on national guidelines would recommend a repeat exam in 7 years for surveillance.     Echo 02/12/2022: EF 65-70%, mild LVH   RUQ [place] 18/24: IMPRESSION: 1. The liver demonstrates cirrhotic morphology. No liver mass identified. No other abnormalities.   RUQ [place] 06/23/23: IMPRESSION: Cirrhotic morphology of the liver. No focal hepatic lesion.    Past Medical History:  Diagnosis Date   Alcoholism (HCC) 06/06/2012   Anxiety    panic attacks   Ascites 02/08/2021   Cirrhosis (HCC) 02/08/2021   Colon polyps    Depression    GERD (gastroesophageal reflux disease)    02/08/21   Hypertension      Past Surgical History:  Procedure Laterality Date   colonoscopy  12/2016   ESOPHAGOGASTRODUODENOSCOPY (EGD) WITH PROPOFOL N/A 02/21/2021   Procedure: ESOPHAGOGASTRODUODENOSCOPY (EGD) WITH PROPOFOL;  Surgeon: Napoleon Form, MD;  Location: [facility];  Service: Endoscopy;  Laterality: N/A;   RADIOLOGY WITH ANESTHESIA N/A 08/16/2021   Procedure: MRI LIVER WITH AND WITHOUT CONTRAST;  Surgeon: Radiologist, Medication, MD;  Location: [facility] OR;  Service: Radiology;  Laterality: N/A;   WISDOM TOOTH EXTRACTION     Family  History  Problem Relation Age of Onset   Skin cancer Mother    Peripheral vascular disease Father    Stomach cancer Father    Congenital heart disease Maternal Grandmother    Heart attack Maternal Grandmother    CAD Maternal Grandmother    Dementia Maternal Grandfather    CAD Maternal Grandfather    Heart attack Maternal Grandfather    Skin cancer Maternal Grandfather    Congenital heart disease Paternal Grandmother    Lung cancer Paternal Grandmother    Cancer Paternal Grandfather    COPD Paternal Grandfather    Colon cancer Neg Hx    Colon polyps Neg Hx    Social History   Tobacco Use   Smoking status: Former    Current packs/day: 0.00    Average packs/day: 3.0 packs/day for 15.0 years (45.0 ttl pk-yrs)    Types: Cigarettes    Start date: 11/24/2005    Quit date: 11/24/2020    Years since quitting: 2.5   Smokeless tobacco: Never  Vaping Use   Vaping status: Never Used  Substance Use Topics   Alcohol use: Not Currently    Alcohol/week: 18.0 - 19.0  standard drinks of alcohol    Types: 18 - 19 Cans of beer per week    Comment: 01/15/2021   Drug use: No   Current Outpatient Medications  Medication Sig Dispense Refill   aMILoride (MIDAMOR) 5 MG tablet TAKE TWO (2) TABLETS BY MOUTH 2 TIMES DAILY 120 tablet 3   folic acid (FOLVITE) 1 MG tablet Take 1 tablet (1 mg total) by mouth daily.     furosemide (LASIX) 40 MG tablet Take 1 tablet (40 mg total) by mouth daily. 90 tablet 0   gabapentin (NEURONTIN) 300 MG capsule Take 1 capsule (300 mg total) by mouth 2 (two) times daily as needed. Please keep your September appointment for further refills. Thank you 180 capsule 0   lactulose (CHRONULAC) 10 GM/15ML solution TAKE (30 GRAMS) BY MOUTH 3 TIMES DAILY. 12150 mL 1   metFORMIN (GLUCOPHAGE) 500 MG tablet Take by mouth 2 (two) times daily with a meal.     multivitamin (PROSIGHT) TABS tablet Take 1 tablet by mouth daily. 30 tablet 0   nadolol (CORGARD) 20 MG tablet TAKE ONE (1)  TABLET BY MOUTH EVERY DAY 90 tablet 1   oxyCODONE (OXY IR/ROXICODONE) 5 MG immediate release tablet Take 5 mg by mouth in the morning and at bedtime.     pantoprazole (PROTONIX) 40 MG tablet Take 1 tablet (40 mg total) by mouth daily. 90 tablet 1   thiamine 100 MG tablet Take 1 tablet (100 mg total) by mouth daily.     No current facility-administered medications for this visit.   Allergies  Allergen Reactions   Atenolol     fatigue   Codeine Itching   Wellbutrin [Bupropion]     Made him smoke more     Review of Systems: All systems reviewed and negative except where noted in HPI.    US Abdomen Limited RUQ (LIVER/GB)  Result Date: 06/23/2023 CLINICAL DATA:  Cirrhosis EXAM: ULTRASOUND ABDOMEN LIMITED RIGHT UPPER QUADRANT COMPARISON:  Ultrasound abdomen limited 11/10/2022 FINDINGS: Gallbladder: Gallbladder is contracted. Gallbladder is not well visualized. Negative sonographic Murphy's sign. Common bile duct: Not visualized Liver: Coarsened echogenicity and nodular contour. No focal hepatic lesion. Portal vein is patent on color Doppler imaging with normal direction of blood flow towards the liver. Other: None. IMPRESSION: Cirrhotic morphology of the liver. No focal hepatic lesion. Electronically Signed   By: Annia Belt M.D.   On: 06/23/2023 09:18     Physical Exam: BP 122/70   Pulse (!) 58   Ht 5\' 11"  (1.803 m)   Wt 254 lb (115.2 kg)   BMI 35.43 kg/m  Constitutional: Pleasant,well-developed, male in no acute distress. Abdominal: Soft, nondistended, nontender. There are no masses palpable. Extremities: (+) 1 LE edema bilaterally Neurological: Alert and oriented to person place and time. Psychiatric: Normal mood and affect. Behavior is normal.   ASSESSMENT: 57 y.o. male here for assessment of the following  1. Alcoholic cirrhosis of liver with ascites (HCC)   2. Esophageal varices without bleeding, unspecified esophageal varices type (HCC)   3. Encephalopathy, hepatic (HCC)     He had been doing well off alcohol on his regimen last January, unfortunately went through divorce with this led to recurrent alcohol use and has had some recurrent problems with lower extremity edema and ascites.  Since stopping drinking in recent weeks his condition has improved.  He is accompanied by his mother today.  We had a lengthy discussion about his cirrhosis, risks for worsening liver function/decompensation with recurrent  alcohol use.  He understands the stakes are high.  If his condition worsens he would need to consider liver transplantation and counseled him he would not be a candidate for liver transplant without alcohol cessation for at least 6 months.  He understands this and states he is receiving therapy with his primary care and working hard on abstinence.  He is due for basic labs today he has not had them in a while.  Will send to the lab for CBC, CMET, INR, AFP.  Will recheck hemoglobin A1c per request from his primary care.  Will continue Lasix and amiloride dosing for now, if his edema fails to resolve off alcohol can titrate up diuretics as his renal function allows..  Continue nadolol, continue lactulose.  Plan on repeat right upper quadrant ultrasound in March for [facility] screening.  If any worsening despite abstaining from alcohol in the interim he needs to contact me.  Otherwise we will see him again in 6 months.  PLAN: - complete alcohol abstinence - coping better, now on Prozac, f/u PCP for this - lab today - CBC, CMET, INR, AFP, A1c  - recall RUQ [place] in March for [facility] [place] screening - continue lasix / amiloride dosing for now - if edema fails to improve can increase dosing if renal function allows - continue nadolol - continue lactulose - f/u 6 months or sooner with issues  Harlin Rain, MD [facility]

## 2023-06-26 NOTE — Patient Instructions (Signed)
Your provider has requested that you go to the basement level for fasting lab work. Press "B" on the elevator. The lab is located at the first door on the left as you exit the elevator.  We have sent the following medications to your pharmacy for you to pick up at your convenience: Lasix, nadolol, and lactulose.   You will be due for your recall ultrasound in 12/2023. We will contact you when it gets closer to that time.   The [place] [facility] providers would like to encourage you to use [facility] to communicate with providers for non-urgent requests or questions.  Due to long hold times on the telephone, sending your provider a message by [facility] may be a faster and more efficient way to get a response.  Please allow 48 business hours for a response.  Please remember that this is for non-urgent requests.   Due to recent changes in healthcare laws, you may see the results of your imaging and laboratory studies on MyChart before your provider has had a chance to review them.  We understand that in some cases there may be results that are confusing or concerning to you. Not all laboratory results come back in the same time frame and the provider may be waiting for multiple results in order to interpret others.  Please give [place] 48 hours in order for your provider to thoroughly review all the results before contacting the office for clarification of your results.   Thank you for entrusting me with your care and for choosing [facility], Dr. Ileene Patrick

## 2023-06-27 ENCOUNTER — Other Ambulatory Visit: Payer: Self-pay

## 2023-06-27 DIAGNOSIS — K7031 Alcoholic cirrhosis of liver with ascites: Secondary | ICD-10-CM

## 2023-06-27 DIAGNOSIS — I8500 Esophageal varices without bleeding: Secondary | ICD-10-CM

## 2023-06-27 DIAGNOSIS — K7682 Hepatic encephalopathy: Secondary | ICD-10-CM

## 2023-06-30 LAB — AFP TUMOR MARKER: AFP-Tumor Marker: 4.4 ng/mL (ref <6.1)

## 2023-06-30 LAB — HEMOGLOBIN A1C
Hgb A1c MFr Bld: 6.1 %{Hb} — ABNORMAL HIGH (ref <5.7)
Mean Plasma Glucose: 128 mg/dL
eAG (mmol/L): 7.1 mmol/L

## 2023-07-10 ENCOUNTER — Other Ambulatory Visit: Payer: Self-pay | Admitting: Gastroenterology

## 2023-07-10 ENCOUNTER — Telehealth: Payer: Self-pay

## 2023-07-10 DIAGNOSIS — K7031 Alcoholic cirrhosis of liver with ascites: Secondary | ICD-10-CM

## 2023-07-10 DIAGNOSIS — I8500 Esophageal varices without bleeding: Secondary | ICD-10-CM

## 2023-07-10 DIAGNOSIS — K7682 Hepatic encephalopathy: Secondary | ICD-10-CM

## 2023-07-10 NOTE — Telephone Encounter (Signed)
-----   Message from Nurse [place] P sent at 06/27/2023 12:38 PM EDT ----- Regarding: 2-week lab reminder LFTs and INR due - orders in epic

## 2023-07-10 NOTE — Telephone Encounter (Signed)
MyChart message sent to patient with lab reminder.

## 2023-07-10 NOTE — Telephone Encounter (Signed)
Lab orders updated to be collected at [facility]; orders also printed and faxed to [facility] lab at [phone].

## 2023-07-10 NOTE — Telephone Encounter (Signed)
06/26/23 labs printed and faxed to Dr. Quintin Alto at [facility] (F: [phone])

## 2023-07-28 ENCOUNTER — Other Ambulatory Visit (HOSPITAL_COMMUNITY)
Admission: RE | Admit: 2023-07-28 | Discharge: 2023-07-28 | Disposition: A | Discharge status: Home / Self Care | Payer: Self-pay | Source: Ambulatory Visit | Attending: Gastroenterology | Admitting: Gastroenterology

## 2023-07-28 DIAGNOSIS — I8500 Esophageal varices without bleeding: Secondary | ICD-10-CM

## 2023-07-28 DIAGNOSIS — K7031 Alcoholic cirrhosis of liver with ascites: Secondary | ICD-10-CM

## 2023-07-28 DIAGNOSIS — K7682 Hepatic encephalopathy: Secondary | ICD-10-CM

## 2023-07-28 LAB — HEPATIC FUNCTION PANEL
ALT: 35 U/L (ref 0–44)
AST: 69 U/L — ABNORMAL HIGH (ref 15–41)
Albumin: 3.3 g/dL — ABNORMAL LOW (ref 3.5–5.0)
Alkaline Phosphatase: 121 U/L (ref 38–126)
Bilirubin, Direct: 0.9 mg/dL — ABNORMAL HIGH (ref 0.0–0.2)
Indirect Bilirubin: 1.8 mg/dL — ABNORMAL HIGH (ref 0.3–0.9)
Total Bilirubin: 2.7 mg/dL — ABNORMAL HIGH (ref 0.3–1.2)
Total Protein: 7.2 g/dL (ref 6.5–8.1)

## 2023-07-28 LAB — PROTIME-INR
INR: 1.3 — ABNORMAL HIGH (ref 0.8–1.2)
Prothrombin Time: 16.7 s — ABNORMAL HIGH (ref 11.4–15.2)

## 2023-08-08 ENCOUNTER — Other Ambulatory Visit: Payer: Self-pay | Admitting: Gastroenterology

## 2023-09-23 ENCOUNTER — Other Ambulatory Visit: Payer: Self-pay | Admitting: Gastroenterology

## 2023-10-22 ENCOUNTER — Telehealth: Payer: Self-pay

## 2023-10-22 ENCOUNTER — Other Ambulatory Visit: Payer: Self-pay

## 2023-10-22 DIAGNOSIS — I8500 Esophageal varices without bleeding: Secondary | ICD-10-CM

## 2023-10-22 DIAGNOSIS — K7031 Alcoholic cirrhosis of liver with ascites: Secondary | ICD-10-CM

## 2023-10-22 DIAGNOSIS — K7682 Hepatic encephalopathy: Secondary | ICD-10-CM

## 2023-10-22 NOTE — Telephone Encounter (Signed)
-----   Message from [facility] H sent at 07/29/2023  9:40 AM EDT ----- Regarding: labs due recall for LFTs and INR in [place]

## 2023-10-22 NOTE — Telephone Encounter (Signed)
 Orders entered.  MyChart message to patient to go to the lab

## 2023-11-11 ENCOUNTER — Other Ambulatory Visit: Payer: Self-pay | Admitting: Gastroenterology

## 2023-12-03 ENCOUNTER — Telehealth: Payer: Self-pay

## 2023-12-03 DIAGNOSIS — K7031 Alcoholic cirrhosis of liver with ascites: Secondary | ICD-10-CM

## 2023-12-03 NOTE — Telephone Encounter (Signed)
 Order placed for RUQ. Message to schedulers, MyChart message to patient

## 2023-12-03 NOTE — Telephone Encounter (Signed)
-----   Message from [facility] Marylu Lund H sent at 06/24/2023  5:29 PM EDT ----- Regarding: ruq due in March RUQ U/S due around 3-10 for Cirrhosis

## 2023-12-14 NOTE — Telephone Encounter (Signed)
-----   Message from [facility] Marchelle Folks M sent at 06/26/2023  3:24 PM EDT ----- Patient needs RUQ ultrasound in March 2025 for cirrhosis, HCC screening.

## 2023-12-14 NOTE — Telephone Encounter (Signed)
 Message sent to schedulers to call patient to schedule.  2nd MyChart message to patient

## 2023-12-23 ENCOUNTER — Other Ambulatory Visit: Payer: Self-pay | Admitting: Gastroenterology

## 2023-12-23 ENCOUNTER — Other Ambulatory Visit (HOSPITAL_COMMUNITY)
Admission: RE | Admit: 2023-12-23 | Discharge: 2023-12-23 | Disposition: A | Discharge status: Home / Self Care | Source: Ambulatory Visit | Attending: Gastroenterology | Admitting: Gastroenterology

## 2023-12-23 DIAGNOSIS — K7682 Hepatic encephalopathy: Secondary | ICD-10-CM | POA: Insufficient documentation

## 2023-12-23 DIAGNOSIS — I8500 Esophageal varices without bleeding: Secondary | ICD-10-CM | POA: Diagnosis not present

## 2023-12-23 DIAGNOSIS — K7031 Alcoholic cirrhosis of liver with ascites: Secondary | ICD-10-CM | POA: Insufficient documentation

## 2023-12-23 LAB — PROTIME-INR
INR: 1.3 — ABNORMAL HIGH (ref 0.8–1.2)
Prothrombin Time: 16.4 s — ABNORMAL HIGH (ref 11.4–15.2)

## 2023-12-23 LAB — HEPATIC FUNCTION PANEL
ALT: 21 U/L (ref 0–44)
AST: 35 U/L (ref 15–41)
Albumin: 3.2 g/dL — ABNORMAL LOW (ref 3.5–5.0)
Alkaline Phosphatase: 117 U/L (ref 38–126)
Bilirubin, Direct: 0.5 mg/dL — ABNORMAL HIGH (ref 0.0–0.2)
Indirect Bilirubin: 1.3 mg/dL — ABNORMAL HIGH (ref 0.3–0.9)
Total Bilirubin: 1.8 mg/dL — ABNORMAL HIGH (ref 0.0–1.2)
Total Protein: 6.1 g/dL — ABNORMAL LOW (ref 6.5–8.1)

## 2023-12-25 ENCOUNTER — Encounter: Payer: Self-pay | Admitting: Gastroenterology

## 2024-01-06 ENCOUNTER — Ambulatory Visit: Payer: Self-pay | Admitting: Gastroenterology

## 2024-01-06 ENCOUNTER — Other Ambulatory Visit

## 2024-01-06 ENCOUNTER — Encounter: Payer: Self-pay | Admitting: Gastroenterology

## 2024-01-06 VITALS — BP 108/62 | HR 64 | Ht 69.75 in | Wt 290.4 lb

## 2024-01-06 DIAGNOSIS — I8500 Esophageal varices without bleeding: Secondary | ICD-10-CM

## 2024-01-06 DIAGNOSIS — R600 Localized edema: Secondary | ICD-10-CM

## 2024-01-06 DIAGNOSIS — K7030 Alcoholic cirrhosis of liver without ascites: Secondary | ICD-10-CM | POA: Diagnosis not present

## 2024-01-06 DIAGNOSIS — F1091 Alcohol use, unspecified, in remission: Secondary | ICD-10-CM | POA: Diagnosis not present

## 2024-01-06 DIAGNOSIS — K7682 Hepatic encephalopathy: Secondary | ICD-10-CM

## 2024-01-06 DIAGNOSIS — K7290 Hepatic failure, unspecified without coma: Secondary | ICD-10-CM | POA: Diagnosis not present

## 2024-01-06 NOTE — Patient Instructions (Signed)
 Please go to the lab in the basement of our building to have lab work done as you leave today. Hit "B" for basement when you get on the elevator.  When the doors open the lab is on your left.  We will call you with the results. Thank you.  We are giving you a low sodium diet handout today to follow.  Continue your diuretics. (As discussed you may hold you nadolol if needed for severe swelling).  Continue lactulose.  Please follow up in 6 months.  Thank you for entrusting me with your care and for choosing [facility], Dr. Ileene Patrick    If your blood pressure at your visit was 140/90 or greater, please contact your primary care physician to follow up on this. ______________________________________________________  If you are age 5 or older, your body mass index should be between 23-30. Your Body mass index is 41.96 kg/m. If this is out of the aforementioned range listed, please consider follow up with your Primary Care Provider.  If you are age 64 or younger, your body mass index should be between 19-25. Your Body mass index is 41.96 kg/m. If this is out of the aformentioned range listed, please consider follow up with your Primary Care Provider.  ________________________________________________________  The [place] [facility] providers would like to encourage you to use [facility] to communicate with providers for non-urgent requests or questions.  Due to long hold times on the telephone, sending your provider a message by [employer] may be a faster and more efficient way to get a response.  Please allow 48 business hours for a response.  Please remember that this is for non-urgent requests.  _______________________________________________________  Due to recent changes in healthcare laws, you may see the results of your imaging and laboratory studies on MyChart before your provider has had a chance to review them.  We understand that in some cases there may be results that are confusing  or concerning to you. Not all laboratory results come back in the same time frame and the provider may be waiting for multiple results in order to interpret others.  Please give [place] 48 hours in order for your provider to thoroughly review all the results before contacting the office for clarification of your results.

## 2024-01-06 NOTE — Progress Notes (Signed)
 HPI :  58 year old male here for a follow-up visit for alcohol related cirrhosis.  He is accompanied by his mother today.   Cirrhosis history: Dx May 2022 when he presented with jaundice, elevated liver enzymes, ascites. Had extensive imaging, lab workup. He was started on Lasix and Aldactone for treatment of his ascites and lower extremity edema, low-dose nadolol for small varices noted on EGD.  Lactulose for HE. Recall that he previously has drank alcohol very heavily, upwards of 24 beers per day plus vodka for the past several years.  He has not drank any alcohol since his diagnosis.  His course has been complicated by development of diabetes. Recall when he was on Aldactone he developed gynecomastia.  We stopped that and switched him to amiloride    SINCE LAST VISIT:   58 year old male here for follow-up visit.  I last saw him in September of last year.  At that point in time he had mentioned to me after a divorce from his wife he had recurrent alcohol use last summer.  He stopped in August and maintains he has been completely abstinent from alcohol since that time, doing a good job in that regard.  Main issue that has been bothering him has been recurrent lower extremity edema.  He has been on Lasix 40 mg daily and amiloride 5 mg twice daily for management of that.  Recall he had gynecomastia with Aldactone in the past.  For the past few days, his primary care has increased Lasix to 40 mg twice daily, with continued amiloride dosing and has also ordered an echocardiogram which is pending.  The patient does endorse some mild dyspnea with exertion.  He finds it is too early to tell a difference to the response to diuretics.  He otherwise states he has not really been paying attention to his sodium intake and eats what ever he wants although on review of his diet does not appear to be eating a lot of sodium or salt rich foods.  He does take nadolol for history of esophageal varices noted on EGD at  time of diagnosis.  He has never had a variceal bleed.  He does take lactulose for hepatic encephalopathy.  He states it typically is taken daily and is compliant with it however when he does not take it he can notice a difference in how he feels.  He states he feels better while taking it on a daily basis.  He is due for [facility] screening, last had an ultrasound in September.  He has had INR, LFTs done a few weeks ago which were stable, as outlined below.  Also due for AFP.  Of note he is due for surveillance colonoscopy for history of colon polyps   Prior workup: CT abdomen pelvis scan 02/15/21: IMPRESSION: Hepatic cirrhosis, and findings of portal venous hypertension including esophageal varices and splenomegaly. Mild-to-moderate ascites. 2 cm ill-defined low-attenuation lesion in posterior right hepatic lobe. Neoplasm cannot be excluded. Abdomen MRI without and with contrast recommended for further characterization. Aortic Atherosclerosis (ICD10-I70.0).     MRI liver 02/16/21: IMPRESSION: 1. The previously demonstrated lesion of concern inferiorly in the right hepatic lobe has nonspecific MR features with delayed ring enhancement and ill-defined T2 signal. These are not the typical features of hepatocellular carcinoma, although non-HCC malignancy such as intrahepatic cholangiocarcinoma or metastatic disease cannot be excluded. Recommend MR follow-up in 3 months. This lesion is likely too small to evaluate or biopsy by ultrasound. 2. Morphologic changes of cirrhosis with  secondary signs of portal hypertension. Mild-to-moderate ascites. 3. Prominent lymph nodes in the porta hepatis, likely reactive.     EGD 02/21/21:Grade II and small (< 5 mm) esophageal varices with no bleeding and no stigmata of recent bleeding. - A small amount of food (residue) in the stomach. - Portal hypertensive gastropathy. - Normal examined duodenum. - No specimens collected.   Hep A / B vaccine 03/15/21,  04/17/21   MRI liver with / without contrast - 08/16/21 -  IMPRESSION: 1. A previously described focus of ill-defined T2 hyperintensity in the inferior right lobe of the liver, hepatic segment VI, is not noted on current examination, nor is previously described rim hypoenhancement noted on multiphasic imaging. This is of uncertain significance and may reflect resolution of a small abscess or other nonspecific infection or inflammation given initial presentation of abdominal pain. 2. No suspicious liver lesion or contrast enhancement at this time. 3. Cirrhosis and splenomegaly. 4. Trace ascites.     Colonoscopy - 02/10/2017 - Dr. Tiburcio Bash [facility] - "excellent prep", one 5-2mm polyp removed from right colon via hot snare - c/w adenoma on path, otherwise normal exam.   Based on national guidelines would recommend a repeat exam in 7 years for surveillance.     Echo 02/12/2022: EF 65-70%, mild LVH   RUQ [place] 18/24: IMPRESSION: 1. The liver demonstrates cirrhotic morphology. No liver mass identified. No other abnormalities.   RUQ [place] 06/23/23: IMPRESSION: Cirrhotic morphology of the liver. No focal hepatic lesion.  Past Medical History:  Diagnosis Date   Alcoholism (HCC) 06/06/2012   Anxiety    panic attacks   Ascites 02/08/2021   Cirrhosis (HCC) 02/08/2021   Colon polyps    Depression    GERD (gastroesophageal reflux disease)    02/08/21   Hypertension      Past Surgical History:  Procedure Laterality Date   colonoscopy  12/2016   ESOPHAGOGASTRODUODENOSCOPY (EGD) WITH PROPOFOL N/A 02/21/2021   Procedure: ESOPHAGOGASTRODUODENOSCOPY (EGD) WITH PROPOFOL;  Surgeon: Napoleon Form, MD;  Location: [facility];  Service: Endoscopy;  Laterality: N/A;   RADIOLOGY WITH ANESTHESIA N/A 08/16/2021   Procedure: MRI LIVER WITH AND WITHOUT CONTRAST;  Surgeon: Radiologist, Medication, MD;  Location: [facility] OR;  Service: Radiology;  Laterality: N/A;   WISDOM TOOTH EXTRACTION      Family History  Problem Relation Age of Onset   Skin cancer Mother    Peripheral vascular disease Father    Stomach cancer Father    Congenital heart disease Maternal Grandmother    Heart attack Maternal Grandmother    CAD Maternal Grandmother    Dementia Maternal Grandfather    CAD Maternal Grandfather    Heart attack Maternal Grandfather    Skin cancer Maternal Grandfather    Congenital heart disease Paternal Grandmother    Lung cancer Paternal Grandmother    Cancer Paternal Grandfather    COPD Paternal Grandfather    Colon cancer Neg Hx    Colon polyps Neg Hx    Social History   Tobacco Use   Smoking status: Former    Current packs/day: 0.00    Average packs/day: 3.0 packs/day for 15.0 years (45.0 ttl pk-yrs)    Types: Cigarettes    Start date: 11/24/2005    Quit date: 11/24/2020    Years since quitting: 3.1   Smokeless tobacco: Never  Vaping Use   Vaping status: Never Used  Substance Use Topics   Alcohol use: Not Currently    Alcohol/week: 18.0 - 19.0  standard drinks of alcohol    Types: 18 - 19 Cans of beer per week    Comment: 01/15/2021   Drug use: No   Current Outpatient Medications  Medication Sig Dispense Refill   aMILoride (MIDAMOR) 5 MG tablet TAKE TWO (2) TABLETS BY MOUTH 2 TIMES DAILY 120 tablet 5   FLUoxetine (PROZAC) 20 MG capsule Take 20 mg by mouth daily.     furosemide (LASIX) 40 MG tablet Take 1 tablet (40 mg total) by mouth daily. (Patient taking differently: Take 40 mg by mouth 2 (two) times daily.) 90 tablet 1   gabapentin (NEURONTIN) 300 MG capsule Take 1 capsule (300 mg total) by mouth 2 (two) times daily as needed. Please keep your March appointment for any further refills. Thank you 60 capsule 1   lactulose (CHRONULAC) 10 GM/15ML solution Take 45 mLs (30 g total) by mouth 3 (three) times daily. 12150 mL 1   metFORMIN (GLUCOPHAGE) 500 MG tablet Take by mouth 2 (two) times daily with a meal.     nadolol (CORGARD) 20 MG tablet Take 1 tablet (20  mg total) by mouth daily. Additional refills will be addressed at March appointment. Thanks 30 tablet 0   oxyCODONE (OXY IR/ROXICODONE) 5 MG immediate release tablet Take 5 mg by mouth in the morning and at bedtime.     pantoprazole (PROTONIX) 40 MG tablet Take 1 tablet (40 mg total) by mouth daily. 90 tablet 1   multivitamin (PROSIGHT) TABS tablet Take 1 tablet by mouth daily. (Patient not taking: Reported on 01/06/2024) 30 tablet 0   No current facility-administered medications for this visit.   Allergies  Allergen Reactions   Atenolol     fatigue   Codeine Itching   Wellbutrin [Bupropion]     Made him smoke more     Review of Systems: All systems reviewed and negative except where noted in HPI.   Lab Results  Component Value Date   ALT 21 12/23/2023   AST 35 12/23/2023   ALKPHOS 117 12/23/2023   BILITOT 1.8 (H) 12/23/2023    Lab Results  Component Value Date   INR 1.3 (H) 12/23/2023   INR 1.3 (H) 07/28/2023   INR 1.6 (H) 06/26/2023    Lab Results  Component Value Date   NA 138 06/26/2023   CL 103 06/26/2023   K 4.2 06/26/2023   CO2 26 06/26/2023   BUN 9 06/26/2023   CREATININE 0.71 06/26/2023   GFR 102.14 06/26/2023   CALCIUM 8.7 06/26/2023   ALBUMIN 3.2 (L) 12/23/2023   GLUCOSE 98 06/26/2023    Lab Results  Component Value Date   WBC 5.0 06/26/2023   HGB 15.0 06/26/2023   HCT 45.0 06/26/2023   MCV 99.6 06/26/2023   PLT 105.0 (L) 06/26/2023    MELD 3.0: 18 at 06/26/2023  3:32 PM MELD-Na: 18 at 06/26/2023  3:32 PM Calculated from: Serum Creatinine: 0.71 mg/dL (Using min of 1 mg/dL) at 1/61/0960  4:54 PM Serum Sodium: 138 mEq/L (Using max of 137 mEq/L) at 06/26/2023  3:32 PM Total Bilirubin: 4.9 mg/dL at 0/98/1191  4:78 PM Serum Albumin: 3.5 g/dL at 2/95/6213  0:86 PM INR(ratio): 1.6 ratio at 06/26/2023  3:32 PM Age at listing (hypothetical): 46 years Sex: Male at 06/26/2023  3:32 PM   Physical Exam: BP 108/62 (BP Location: Left Arm, Patient Position:  Sitting, Cuff Size: Large)   Pulse 64   Ht 5' 9.75" (1.772 m) Comment: height measured without shoes  Wt 290 lb  6 oz (131.7 kg)   BMI 41.96 kg/m  Constitutional: Pleasant,well-developed, male in no acute distress. Extremities: tight edema / swelling in LE B Neurological: Alert and oriented to person place and time. Skin: Skin is warm and dry. No rashes noted. Psychiatric: Normal mood and affect. Behavior is normal.   ASSESSMENT: 58 y.o. male here for assessment of the following  1. Alcoholic cirrhosis, unspecified whether ascites present (HCC)   2. Esophageal varices without bleeding, unspecified esophageal varices type (HCC)   3. Encephalopathy, hepatic (HCC)   4. Lower extremity edema    Has done a good job maintaining abstinence from alcohol since of last seen him.  He should continue to do so, counseled on risks of hepatic decompensation with continued alcohol use.  He understands.  Having issues with volume retention and lower extremity edema.  I agree with increasing his diuretic dosing, he is following with his primary care for that and will need follow-up of [place] to make sure his kidney function stable.  I reviewed low-sodium diet with him, he has not been following that.  He needs to minimize his sodium intake to less than 2000 mg daily and gave him a handout about that.  He will do his best to comply.  If he finds that he still has persistent edema despite these measures he can hold nadolol for a few weeks if needed.  If long-term he needs to be off nadolol he would need a repeat EGD at the hospital with banding of varices in this situation.  Hopefully that is not needed.  I agree with echocardiogram given his issues with volume status recently, as ordered by primary care.  He will continue lactulose for now, tolerates it well and working to control encephalopathy.  We discussed his risks for HCC.  Due for right upper quadrant ultrasound this month and we will draw AFP  today.  He is due for surveillance colonoscopy however I do not feel ready for it right now, awaiting improvement in volume status and echocardiogram prior to scheduling, can do this in the upcoming months when he is feeling better.  Follow-up in 6 months or sooner if any issues   PLAN: - low Na diet, handout given,  < 2 gm / day - continue alcohol abstinence - continue diuretics - on higher dosing of lasix - due to see PCP and labs next week - can hold nadolol if needed. If needs to hold will need to repeat EGD at hospital, consider banding - echocardiogram per PCP - continue lactulose - AFP today - RUQ [place]  - holding off on scheduling on scheduling colonoscopy right now until edema / cardiac evaluation done / improved - fu 6 months or sooner  Harlin Rain, MD [facility]

## 2024-01-08 ENCOUNTER — Ambulatory Visit (HOSPITAL_COMMUNITY)
Admission: RE | Admit: 2024-01-08 | Discharge: 2024-01-08 | Disposition: A | Discharge status: Home / Self Care | Source: Ambulatory Visit | Attending: Gastroenterology | Admitting: Gastroenterology

## 2024-01-08 ENCOUNTER — Encounter: Payer: Self-pay | Admitting: Gastroenterology

## 2024-01-08 DIAGNOSIS — K7031 Alcoholic cirrhosis of liver with ascites: Secondary | ICD-10-CM | POA: Diagnosis not present

## 2024-01-08 DIAGNOSIS — K7460 Unspecified cirrhosis of liver: Secondary | ICD-10-CM | POA: Diagnosis not present

## 2024-01-08 LAB — AFP TUMOR MARKER: AFP-Tumor Marker: 3.6 ng/mL

## 2024-01-13 DIAGNOSIS — I517 Cardiomegaly: Secondary | ICD-10-CM | POA: Diagnosis not present

## 2024-01-13 DIAGNOSIS — R600 Localized edema: Secondary | ICD-10-CM | POA: Diagnosis not present

## 2024-01-13 DIAGNOSIS — I272 Pulmonary hypertension, unspecified: Secondary | ICD-10-CM | POA: Diagnosis not present

## 2024-01-13 DIAGNOSIS — I340 Nonrheumatic mitral (valve) insufficiency: Secondary | ICD-10-CM | POA: Diagnosis not present

## 2024-01-13 DIAGNOSIS — R0602 Shortness of breath: Secondary | ICD-10-CM | POA: Diagnosis not present

## 2024-01-15 DIAGNOSIS — R319 Hematuria, unspecified: Secondary | ICD-10-CM | POA: Diagnosis not present

## 2024-01-15 DIAGNOSIS — R635 Abnormal weight gain: Secondary | ICD-10-CM | POA: Diagnosis not present

## 2024-01-15 DIAGNOSIS — Z6839 Body mass index (BMI) 39.0-39.9, adult: Secondary | ICD-10-CM | POA: Diagnosis not present

## 2024-01-15 DIAGNOSIS — R600 Localized edema: Secondary | ICD-10-CM | POA: Diagnosis not present

## 2024-01-15 DIAGNOSIS — I8500 Esophageal varices without bleeding: Secondary | ICD-10-CM | POA: Diagnosis not present

## 2024-01-15 DIAGNOSIS — K7460 Unspecified cirrhosis of liver: Secondary | ICD-10-CM | POA: Diagnosis not present

## 2024-01-27 DIAGNOSIS — K7460 Unspecified cirrhosis of liver: Secondary | ICD-10-CM | POA: Diagnosis not present

## 2024-01-27 DIAGNOSIS — R935 Abnormal findings on diagnostic imaging of other abdominal regions, including retroperitoneum: Secondary | ICD-10-CM | POA: Diagnosis not present

## 2024-01-27 DIAGNOSIS — R161 Splenomegaly, not elsewhere classified: Secondary | ICD-10-CM | POA: Diagnosis not present

## 2024-01-27 DIAGNOSIS — K766 Portal hypertension: Secondary | ICD-10-CM | POA: Diagnosis not present

## 2024-01-27 DIAGNOSIS — K7469 Other cirrhosis of liver: Secondary | ICD-10-CM | POA: Diagnosis not present

## 2024-01-27 DIAGNOSIS — I868 Varicose veins of other specified sites: Secondary | ICD-10-CM | POA: Diagnosis not present

## 2024-01-27 DIAGNOSIS — R590 Localized enlarged lymph nodes: Secondary | ICD-10-CM | POA: Diagnosis not present

## 2024-01-27 DIAGNOSIS — N3289 Other specified disorders of bladder: Secondary | ICD-10-CM | POA: Diagnosis not present

## 2024-01-27 DIAGNOSIS — R932 Abnormal findings on diagnostic imaging of liver and biliary tract: Secondary | ICD-10-CM | POA: Diagnosis not present

## 2024-01-27 DIAGNOSIS — R319 Hematuria, unspecified: Secondary | ICD-10-CM | POA: Diagnosis not present

## 2024-02-03 ENCOUNTER — Telehealth: Payer: Self-pay | Admitting: Gastroenterology

## 2024-02-03 DIAGNOSIS — K7030 Alcoholic cirrhosis of liver without ascites: Secondary | ICD-10-CM

## 2024-02-03 NOTE — Telephone Encounter (Signed)
 Left message for patient to call back

## 2024-02-03 NOTE — Telephone Encounter (Signed)
 Left message for pt to call back

## 2024-02-03 NOTE — Telephone Encounter (Signed)
 Received the report of the CT scan the patient apparently had on April 15.  Sound like he was in the ER for hematuria at [facility] in [place].  The result of this exam was faxed to me.  CT abdomen / pelvis without contrast 01/27/24 -formal report in care everywhere. They see cirrhosis of the liver with evidence of portal hypertension.  There are 2 hypoattenuating foci in the right hepatic lobe measuring up to 9 mm and incompletely evaluated by the noncontrast CT.  They are recommending an MRI to further evaluate this.  His AFP was recently normal, he has had an MRI back in 2022 x 2 my suspicion is that these lesions on the CT scan are benign however given the findings and he has not had an MRI in a few years, with the radiology recommendations, I think obligated to recommend an MRI of his liver with and without contrast to further evaluate.  POD A RN: Can you please let the patient know I received report of his CT and see if he is willing to schedule MRI liver to further evaluate?  Can you help coordinate if so? Thanks

## 2024-02-03 NOTE — Telephone Encounter (Signed)
 Pt aware. He sates he is severely claustrophobic and tried 3 times to do the MRI and was unable to do it. Pt states he had to be sedated and have it at the hospital. He wasn't sure if he really has to do this, Please advise.

## 2024-02-04 ENCOUNTER — Other Ambulatory Visit: Payer: Self-pay | Admitting: Gastroenterology

## 2024-02-04 NOTE — Telephone Encounter (Signed)
 Okay, sorry to hear that.  I think okay to hold off on the MRI right now.  May be reasonable to do another CT scan of his liver with contrast (CT liver protocol) in a few months if he is otherwise willing to do that.  AFP is normal and reassuring.  Having him sedated the hospital for MRI will be difficult for him to do, we may need to consider that however if the CT liver shows a problem.  Thanks.

## 2024-02-04 NOTE — Telephone Encounter (Signed)
 Pt aware and willing to do repeat ct in . Reminder in epic.

## 2024-03-05 ENCOUNTER — Ambulatory Visit: Admitting: Urology

## 2024-03-22 ENCOUNTER — Other Ambulatory Visit: Payer: Self-pay | Admitting: Gastroenterology

## 2024-03-24 DIAGNOSIS — R600 Localized edema: Secondary | ICD-10-CM | POA: Diagnosis not present

## 2024-03-24 DIAGNOSIS — K7460 Unspecified cirrhosis of liver: Secondary | ICD-10-CM | POA: Diagnosis not present

## 2024-03-24 DIAGNOSIS — I8500 Esophageal varices without bleeding: Secondary | ICD-10-CM | POA: Diagnosis not present

## 2024-03-24 DIAGNOSIS — Z6838 Body mass index (BMI) 38.0-38.9, adult: Secondary | ICD-10-CM | POA: Diagnosis not present

## 2024-03-24 DIAGNOSIS — R635 Abnormal weight gain: Secondary | ICD-10-CM | POA: Diagnosis not present

## 2024-03-25 ENCOUNTER — Other Ambulatory Visit: Payer: Self-pay

## 2024-03-25 DIAGNOSIS — K7030 Alcoholic cirrhosis of liver without ascites: Secondary | ICD-10-CM

## 2024-03-26 ENCOUNTER — Ambulatory Visit: Admitting: Urology

## 2024-03-26 ENCOUNTER — Encounter: Payer: Self-pay | Admitting: Urology

## 2024-03-26 ENCOUNTER — Other Ambulatory Visit: Payer: Self-pay

## 2024-03-26 VITALS — BP 111/68 | HR 55

## 2024-03-26 DIAGNOSIS — R310 Gross hematuria: Secondary | ICD-10-CM

## 2024-03-26 LAB — URINALYSIS, ROUTINE W REFLEX MICROSCOPIC
Bilirubin, UA: NEGATIVE
Glucose, UA: NEGATIVE
Leukocytes,UA: NEGATIVE
Nitrite, UA: NEGATIVE
RBC, UA: NEGATIVE
Specific Gravity, UA: 1.015 (ref 1.005–1.030)
Urobilinogen, Ur: 4 mg/dL — ABNORMAL HIGH (ref 0.2–1.0)
pH, UA: 6 (ref 5.0–7.5)

## 2024-03-26 LAB — BLADDER SCAN AMB NON-IMAGING: Scan Result: 47

## 2024-03-26 NOTE — Patient Instructions (Signed)
 Blood in the Pee (Hematuria) in Adults: What to Know  Hematuria is blood in the pee. You may be able to see blood in the pee. In some cases, a health care provider may find blood with a test.  Blood in the pee can be caused by infections of the kidney, bladder, or the urethra. The urethra is the tube that drains pee from the bladder.  Other causes may include: Kidney stones. Infection of the prostate. Cancer. Too much calcium in the pee. Conditions that are passed from parent to child. Too much exercise. Infections can be treated with medicine. A kidney stone will usually leave your body when you pee. If infections or kidney stones didn't cause the blood in the urine, then more tests may be needed. It is very important to tell your provider about any blood in your pee, even if you have no pain or the blood stops with no treatment. Blood in the pee can be a sign of a very serious problem, such as cancer. Follow these instructions at home: Medicines Take your medicines only as told. If you were given antibiotics, take them as told. Do not stop taking them even if you start to feel better. Eating and drinking Drink more fluids as told. Aim to drink 3-4 quarts (2.8-3.8 L) a day. Avoid caffeine, tea, and carbonated drinks. These can bother the bladder. Avoid alcohol if a male because it may irritate the prostate. General instructions If you have been diagnosed with a kidney stone, strain your pee to catch the stone if told by your provider. Empty your bladder often. Avoid holding pee for a long time. If you're male, make sure that: You wipe from front to back after using the bathroom. You use each piece of toilet paper only once. You pee before and after sex. It's up to you to get the results of any tests. Ask when your results will be ready and how to get them. You may need to call or meet with your provider to get your results. Keep all follow-up visits. Your provider will need to know  about any changes or any new symptoms. Contact a health care provider if: Your symptoms don't get better after 3 days. Your symptoms get worse. You have back pain or belly pain. You have a fever or chills. You throw up or feel like you may throw up. You throw up every time you take medicine. Get help right away if: You pass blood clots in your pee. You pass out. These symptoms may be an emergency. Call 911 right away. Do not wait to see if the symptoms will go away. Do not drive yourself to the hospital. This information is not intended to replace advice given to you by your health care provider. Make sure you discuss any questions you have with your health care provider. Document Revised: 07/17/2023 Document Reviewed: 06/26/2023 Elsevier Patient Education  2024 [employer].

## 2024-03-26 NOTE — Progress Notes (Signed)
post void residual =47  

## 2024-03-26 NOTE — Progress Notes (Signed)
 03/26/2024 11:34 AM   Adam Hartman September 26, 1966 [id number]  Referring provider: Alston Jerry, MD [address] [place],  [place] [address]  Gross hematuria   HPI: [name] Is a 57yo here for evaluation of gross. He has had 2 episodes of gross painless hematuria that last several days. No history of nephrolithiasis. He has a hx of cirrhosis. He has a 60-70 pk year smoking history. No dysuria. IPSS 8 QOL 2. [place]irne stream strong. No straining to urinate   PMH: Past Medical History:  Diagnosis Date   Alcoholism (HCC) 06/06/2012   Anxiety    panic attacks   Ascites 02/08/2021   Cirrhosis (HCC) 02/08/2021   Colon polyps    Depression    GERD (gastroesophageal reflux disease)    02/08/21   Hypertension     Surgical History: Past Surgical History:  Procedure Laterality Date   colonoscopy  12/2016   ESOPHAGOGASTRODUODENOSCOPY (EGD) WITH PROPOFOL  N/A 02/21/2021   Procedure: ESOPHAGOGASTRODUODENOSCOPY (EGD) WITH PROPOFOL ;  Surgeon: Adam Dandy, MD;  Location: [facility];  Service: Endoscopy;  Laterality: N/A;   RADIOLOGY WITH ANESTHESIA N/A 08/16/2021   Procedure: MRI LIVER WITH AND WITHOUT CONTRAST;  Surgeon: Radiologist, Medication, MD;  Location: [facility] OR;  Service: Radiology;  Laterality: N/A;   WISDOM TOOTH EXTRACTION      Home Medications:  Allergies as of 03/26/2024       Reactions   Atenolol     fatigue   Codeine Itching   Wellbutrin [bupropion]    Made him smoke more        Medication List        Accurate as of March 26, 2024 11:34 AM. If you have any questions, ask your nurse or doctor.          aMILoride  5 MG tablet Commonly known as: MIDAMOR  TAKE TWO (2) TABLETS BY MOUTH 2 TIMES DAILY   FLUoxetine 20 MG capsule Commonly known as: PROZAC Take 20 mg by mouth daily.   furosemide  40 MG tablet Commonly known as: LASIX  Take 1 tablet (40 mg total) by mouth daily. What changed: when to take this   gabapentin  300 MG capsule Commonly known  as: NEURONTIN  TAKE 1 CAPSULE BY MOUTH TWICE DAILY AS NEEDED   lactulose  10 GM/15ML solution Commonly known as: CHRONULAC  Take 45 mLs (30 g total) by mouth 3 (three) times daily.   metFORMIN 500 MG tablet Commonly known as: GLUCOPHAGE Take by mouth 2 (two) times daily with a meal.   multivitamin Tabs tablet Take 1 tablet by mouth daily.   nadolol  20 MG tablet Commonly known as: CORGARD  TAKE ONE (1) TABLET BY MOUTH EVERY DAY   oxyCODONE  5 MG immediate release tablet Commonly known as: Oxy IR/ROXICODONE  Take 5 mg by mouth in the morning and at bedtime.   pantoprazole  40 MG tablet Commonly known as: PROTONIX  Take 1 tablet (40 mg total) by mouth daily.        Allergies:  Allergies  Allergen Reactions   Atenolol      fatigue   Codeine Itching   Wellbutrin [Bupropion]     Made him smoke more    Family History: Family History  Problem Relation Age of Onset   Skin cancer Mother    Peripheral vascular disease Father    Stomach cancer Father    Congenital heart disease Maternal Grandmother    Heart attack Maternal Grandmother    CAD Maternal Grandmother    Dementia Maternal Grandfather    CAD Maternal Grandfather  Heart attack Maternal Grandfather    Skin cancer Maternal Grandfather    Congenital heart disease Paternal Grandmother    Lung cancer Paternal Grandmother    Cancer Paternal Grandfather    COPD Paternal Grandfather    Colon cancer Neg Hx    Colon polyps Neg Hx     Social History:  reports that he quit smoking about 3 years ago. His smoking use included cigarettes. He started smoking about 18 years ago. He has a 45 pack-year smoking history. He has never used smokeless tobacco. He reports that he does not currently use alcohol  after a past usage of about 18.0 - 19.0 standard drinks of alcohol  per week. He reports that he does not use drugs.  ROS: All other review of systems were reviewed and are negative except what is noted above in HPI  Physical  Exam: BP 111/68   Pulse (!) 55   Constitutional:  Alert and oriented, No acute distress. HEENT: [place] AT, moist mucus membranes.  Trachea midline, no masses. Cardiovascular: No clubbing, cyanosis, or edema. Respiratory: Normal respiratory effort, no increased work of breathing. GI: Abdomen is soft, nontender, nondistended, no abdominal masses GU: No CVA tenderness.  Lymph: No cervical or inguinal lymphadenopathy. Skin: No rashes, bruises or suspicious lesions. Neurologic: Grossly intact, no focal deficits, moving all 4 extremities. Psychiatric: Normal mood and affect.  Laboratory Data: Lab Results  Component Value Date   WBC 5.0 06/26/2023   HGB 15.0 06/26/2023   HCT 45.0 06/26/2023   MCV 99.6 06/26/2023   PLT 105.0 (L) 06/26/2023    Lab Results  Component Value Date   CREATININE 0.71 06/26/2023    No results found for: PSA  No results found for: TESTOSTERONE  Lab Results  Component Value Date   HGBA1C 6.1 (H) 06/26/2023    Urinalysis    Component Value Date/Time   COLORURINE YELLOW 02/15/2021 1856   APPEARANCEUR CLEAR 02/15/2021 1856   LABSPEC 1.005 02/15/2021 1856   PHURINE 7.0 02/15/2021 1856   GLUCOSEU NEGATIVE 02/15/2021 1856   HGBUR NEGATIVE 02/15/2021 1856   BILIRUBINUR NEGATIVE 02/15/2021 1856   KETONESUR NEGATIVE 02/15/2021 1856   PROTEINUR NEGATIVE 02/15/2021 1856   UROBILINOGEN 0.2 06/05/2012 0745   NITRITE NEGATIVE 02/15/2021 1856   LEUKOCYTESUR NEGATIVE 02/15/2021 1856    No results found for: LABMICR, WBCUA, RBCUA, LABEPIT, MUCUS, BACTERIA  Pertinent Imaging:  No results found for this or any previous visit.  No results found for this or any previous visit.  No results found for this or any previous visit.  No results found for this or any previous visit.  No results found for this or any previous visit.  No results found for this or any previous visit.  No results found for this or any previous visit.  No results  found for this or any previous visit.   Assessment & Plan:    1. Gross hematuria (Primary) CT hematuria protocol -office cystoscopy  -urine cytology - Urinalysis, Routine w reflex microscopic - BLADDER SCAN AMB NON-IMAGING   No follow-ups on file.  Adam Nailer, MD  [facility] [place]

## 2024-03-29 LAB — CYTOLOGY, URINE

## 2024-03-31 ENCOUNTER — Ambulatory Visit: Payer: Self-pay

## 2024-04-09 ENCOUNTER — Encounter (HOSPITAL_COMMUNITY): Payer: Self-pay

## 2024-04-09 ENCOUNTER — Ambulatory Visit (HOSPITAL_COMMUNITY)
Admission: RE | Admit: 2024-04-09 | Discharge: 2024-04-09 | Disposition: A | Discharge status: Home / Self Care | Source: Ambulatory Visit | Attending: Urology | Admitting: Urology

## 2024-04-09 DIAGNOSIS — R310 Gross hematuria: Secondary | ICD-10-CM | POA: Insufficient documentation

## 2024-04-09 DIAGNOSIS — R1010 Upper abdominal pain, unspecified: Secondary | ICD-10-CM | POA: Diagnosis not present

## 2024-04-09 DIAGNOSIS — R161 Splenomegaly, not elsewhere classified: Secondary | ICD-10-CM | POA: Diagnosis not present

## 2024-04-09 DIAGNOSIS — K5730 Diverticulosis of large intestine without perforation or abscess without bleeding: Secondary | ICD-10-CM | POA: Diagnosis not present

## 2024-04-09 DIAGNOSIS — K766 Portal hypertension: Secondary | ICD-10-CM | POA: Diagnosis not present

## 2024-04-09 DIAGNOSIS — K4090 Unilateral inguinal hernia, without obstruction or gangrene, not specified as recurrent: Secondary | ICD-10-CM | POA: Diagnosis not present

## 2024-04-09 LAB — POCT I-STAT CREATININE: Creatinine, Ser: 0.9 mg/dL (ref 0.61–1.24)

## 2024-04-09 MED ORDER — IOHEXOL 300 MG/ML  SOLN
100.0000 mL | Freq: Once | INTRAMUSCULAR | Status: AC | PRN
  Administered 2024-04-09: 100 mL via INTRAVENOUS

## 2024-04-09 MED ORDER — SODIUM CHLORIDE (PF) 0.9 % IJ SOLN
INTRAMUSCULAR | Status: AC
  Filled 2024-04-09: qty 50

## 2024-04-09 MED ORDER — SODIUM CHLORIDE 0.9 % IV SOLN
INTRAVENOUS | Status: AC
  Filled 2024-04-09: qty 250

## 2024-04-17 ENCOUNTER — Other Ambulatory Visit: Payer: Self-pay | Admitting: Gastroenterology

## 2024-04-19 NOTE — Telephone Encounter (Signed)
 I can refill it for 3 months but further refills should come from his PCP. Thanks

## 2024-05-05 ENCOUNTER — Other Ambulatory Visit: Admitting: Urology

## 2024-05-24 ENCOUNTER — Other Ambulatory Visit: Payer: Self-pay | Admitting: Gastroenterology

## 2024-05-31 NOTE — Telephone Encounter (Signed)
===  View-only below this line=== ----- Message ----- From: Leigh Elspeth SQUIBB, MD Sent: 05/28/2024   6:03 PM EDT To: [name], RN  Okay yes that is good. Let's repeat an AFP in September and a RUQ US  in December. Thanks ----- Message ----- From: Sharps Naomie LOISE, RN Sent: 05/28/2024   5:22 PM EDT To: Elspeth SQUIBB Leigh, MD  See 02/03/24 telephone note; patient had CT abd/pelvis for hematuria workup. Does this give you the information you were needing or do we need to pursue additional testing specifically of the liver?

## 2024-05-31 NOTE — Addendum Note (Signed)
 Addended by: CLAUDENE NAOMIE SAILOR on: 05/31/2024 09:43 AM   Modules accepted: Orders

## 2024-05-31 NOTE — Telephone Encounter (Signed)
 Reminders placed in EPIC for repeat AFP in September 2025 and repeat RUQ u/s in December 2025.

## 2024-06-22 ENCOUNTER — Telehealth: Payer: Self-pay

## 2024-06-22 DIAGNOSIS — K7030 Alcoholic cirrhosis of liver without ascites: Secondary | ICD-10-CM

## 2024-06-22 NOTE — Telephone Encounter (Signed)
 Order placed.  Message to patient and message to schedulers to call patient to schedule

## 2024-06-22 NOTE — Telephone Encounter (Signed)
-----   Message from [facility] sent at 06/16/2024  8:53 AM EDT ----- Regarding: FW: RUQ U/S  ----- Message ----- From: Edmundo Clarita HERO, CMA Sent: 06/16/2024  12:00 AM EDT To: Clarita HERO Edmundo, CMA Subject: RUQ U/S                                        Due for RUQ U/S in mid to late Sept

## 2024-06-23 ENCOUNTER — Other Ambulatory Visit: Payer: Self-pay | Admitting: Gastroenterology

## 2024-06-25 ENCOUNTER — Encounter: Payer: Self-pay | Admitting: *Deleted

## 2024-06-25 DIAGNOSIS — E1169 Type 2 diabetes mellitus with other specified complication: Secondary | ICD-10-CM | POA: Diagnosis not present

## 2024-07-05 DIAGNOSIS — Z6841 Body Mass Index (BMI) 40.0 and over, adult: Secondary | ICD-10-CM | POA: Diagnosis not present

## 2024-07-05 DIAGNOSIS — K766 Portal hypertension: Secondary | ICD-10-CM | POA: Diagnosis not present

## 2024-07-05 DIAGNOSIS — K7460 Unspecified cirrhosis of liver: Secondary | ICD-10-CM | POA: Diagnosis not present

## 2024-07-05 DIAGNOSIS — D696 Thrombocytopenia, unspecified: Secondary | ICD-10-CM | POA: Diagnosis not present

## 2024-07-05 DIAGNOSIS — E1169 Type 2 diabetes mellitus with other specified complication: Secondary | ICD-10-CM | POA: Diagnosis not present

## 2024-07-19 ENCOUNTER — Other Ambulatory Visit: Payer: Self-pay | Admitting: Gastroenterology

## 2024-07-27 ENCOUNTER — Ambulatory Visit (HOSPITAL_COMMUNITY)
Admission: RE | Admit: 2024-07-27 | Discharge: 2024-07-27 | Disposition: A | Discharge status: Home / Self Care | Source: Ambulatory Visit | Attending: Gastroenterology | Admitting: Gastroenterology

## 2024-07-27 DIAGNOSIS — K7030 Alcoholic cirrhosis of liver without ascites: Secondary | ICD-10-CM | POA: Diagnosis not present

## 2024-07-27 DIAGNOSIS — K7689 Other specified diseases of liver: Secondary | ICD-10-CM | POA: Diagnosis not present

## 2024-07-28 ENCOUNTER — Ambulatory Visit: Payer: Self-pay | Admitting: Gastroenterology

## 2024-09-02 ENCOUNTER — Other Ambulatory Visit: Payer: Self-pay | Admitting: Gastroenterology

## 2024-09-14 NOTE — Progress Notes (Unsigned)
 Adam Console, PA-C [address] [place], [place]  [address] Phone: [phone]   Primary Care Physician: [name], MD  Primary Gastroenterologist:  Adam Console, PA-C / [name], MD   Chief Complaint: Follow-up alcoholic cirrhosis       HPI:   Discussed the use of AI scribe software for clinical note transcription with the patient, who gave verbal consent to proceed.  Patient last saw Dr. Naval for follow-up visit 12/2023.  Has been abstinent from alcohol  since August 2024.  He is on Lasix  40 mg daily and amiloride  5 mg twice daily for extremity edema.  Takes nadolol  20 mg daily due to history of esophageal varices.  He has never had variceal bleed.  Takes lactulose  45 mL 3 times daily for hepatic encephalopathy.  Is on pantoprazole  40 mg once daily for GERD.  Has history of adenomatous colon polyps.  Cirrhosis history: Dx May 2022 when he presented with jaundice, elevated liver enzymes, ascites. Had extensive imaging, lab workup. He was started on Lasix  and Aldactone  for treatment of his ascites and lower extremity edema, low-dose nadolol  for small varices noted on EGD.  Lactulose  for HE. Recall that he previously has drank alcohol  very heavily, upwards of 24 beers per day plus vodka for the past several years.  He has not drank any alcohol  since his diagnosis.  His course has been complicated by development of diabetes. Recall when he was on Aldactone  he developed gynecomastia.  We stopped that and switched him to amiloride    History of Present Illness   02/2021 last EGD: 4 columns of grade 2 and small less than 5 mm esophageal varices with no bleeding.  Small amount of food in the stomach.  Portal hypertensive gastropathy.  Normal duodenum.  No biopsies.  Last colonoscopy?  12/2023 labs:  AFP normal (3.6).  Total bilirubin 1.8, AST 35, ALT 21, alk phos 117, albumin 3.2, PT 16.4, INR 1.3.  07/27/2024 last RUQ liver ultrasound: Liver cirrhosis.  No  suspicious hepatic lesions.  1.0 cm simple cyst in the right hepatic lobe.  Normal gallbladder.    Current Outpatient Medications  Medication Sig Dispense Refill   aMILoride  (MIDAMOR ) 5 MG tablet TAKE TWO (2) TABLETS BY MOUTH 2 TIMES DAILY 120 tablet 5   FLUoxetine (PROZAC) 20 MG capsule Take 20 mg by mouth daily.     furosemide  (LASIX ) 40 MG tablet Take 1 tablet (40 mg total) by mouth daily. (Patient taking differently: Take 40 mg by mouth 2 (two) times daily.) 90 tablet 1   gabapentin  (NEURONTIN ) 300 MG capsule Take 1 capsule (300 mg total) by mouth 2 (two) times daily as needed. Please direct any further refill requests to your Primary doctor for ongoing management. Thank you 60 capsule 1   lactulose  (CONSTULOSE ) 10 GM/15ML solution Take 45 mLs (30 g total) by mouth 3 (three) times daily. 12150 mL 0   metFORMIN (GLUCOPHAGE) 500 MG tablet Take by mouth 2 (two) times daily with a meal.     multivitamin (PROSIGHT) TABS tablet Take 1 tablet by mouth daily. (Patient not taking: Reported on 01/06/2024) 30 tablet 0   nadolol  (CORGARD ) 20 MG tablet Take 1 tablet (20 mg total) by mouth daily. Please keep your upcoming appointment for further refills. Thank you 30 tablet 1   oxyCODONE  (OXY IR/ROXICODONE ) 5 MG immediate release tablet Take 5 mg by mouth in the morning and at bedtime.     pantoprazole  (PROTONIX ) 40 MG tablet  Take 1 tablet (40 mg total) by mouth daily. 90 tablet 1   No current facility-administered medications for this visit.    Allergies as of 09/15/2024 - Review Complete 04/09/2024  Allergen Reaction Noted   Atenolol   02/08/2021   Codeine Itching 06/05/2012   Wellbutrin [bupropion]  02/08/2021    Past Medical History:  Diagnosis Date   Alcoholism (HCC) 06/06/2012   Anxiety    panic attacks   Ascites 02/08/2021   Cirrhosis (HCC) 02/08/2021   Colon polyps    Depression    GERD (gastroesophageal reflux disease)    02/08/21   Hypertension     Past Surgical History:   Procedure Laterality Date   colonoscopy  12/2016   ESOPHAGOGASTRODUODENOSCOPY (EGD) WITH PROPOFOL  N/A 02/21/2021   Procedure: ESOPHAGOGASTRODUODENOSCOPY (EGD) WITH PROPOFOL ;  Surgeon: Shila Gustav GAILS, MD;  Location: [facility];  Service: Endoscopy;  Laterality: N/A;   RADIOLOGY WITH ANESTHESIA N/A 08/16/2021   Procedure: MRI LIVER WITH AND WITHOUT CONTRAST;  Surgeon: Radiologist, Medication, MD;  Location: [facility] OR;  Service: Radiology;  Laterality: N/A;   WISDOM TOOTH EXTRACTION      Review of Systems:    All systems reviewed and negative except where noted in HPI.    Physical Exam:  There were no vitals taken for this visit. No LMP for male patient.  General: Well-nourished, well-developed in no acute distress.  Lungs: Clear to auscultation bilaterally. Non-labored. Heart: Regular rate and rhythm, no murmurs rubs or gallops.  Abdomen: Bowel sounds are normal; Abdomen is Soft; No hepatosplenomegaly, masses or hernias;  No Abdominal Tenderness; No guarding or rebound tenderness. Neuro: Alert and oriented x 3.  Grossly intact.  Psych: Alert and cooperative, normal mood and affect.   Imaging Studies: No results found.  Labs: CBC    Component Value Date/Time   WBC 5.0 06/26/2023 1532   RBC 4.52 06/26/2023 1532   HGB 15.0 06/26/2023 1532   HCT 45.0 06/26/2023 1532   PLT 105.0 (L) 06/26/2023 1532   MCV 99.6 06/26/2023 1532   MCH 34.8 (H) 06/05/2021 1349   MCHC 33.4 06/26/2023 1532   RDW 15.8 (H) 06/26/2023 1532   LYMPHSABS 1.1 06/26/2023 1532   MONOABS 0.6 06/26/2023 1532   EOSABS 0.1 06/26/2023 1532   BASOSABS 0.0 06/26/2023 1532    CMP     Component Value Date/Time   NA 138 06/26/2023 1532   K 4.2 06/26/2023 1532   CL 103 06/26/2023 1532   CO2 26 06/26/2023 1532   GLUCOSE 98 06/26/2023 1532   BUN 9 06/26/2023 1532   CREATININE 0.90 04/09/2024 1405   CALCIUM 8.7 06/26/2023 1532   PROT 6.1 (L) 12/23/2023 1132   ALBUMIN 3.2 (L) 12/23/2023 1132   AST 35  12/23/2023 1132   ALT 21 12/23/2023 1132   ALKPHOS 117 12/23/2023 1132   BILITOT 1.8 (H) 12/23/2023 1132   GFRNONAA >60 07/24/2022 1552   GFRAA >90 06/05/2012 0733       Assessment and Plan:   Ayvion Kavanagh is a 58 y.o. y/o male returns for follow-up of  1.  Alcoholic cirrhosis with extremity edema 2.  Hepatic encephalopathy 3.  Esophageal varices 4.  History of adenomatous colon polyps  Plan: - Labs CBC, CMP, PT/INR, AFP - Next liver imaging to screen for [facility] will be due 01/2025. - Continue current medications - Continue low-sodium diet - Schedule EGD for surveillance of esophageal varices - Schedule colonoscopy for surveillance of colon polyps  Assessment and Plan Assessment & Plan  Adam Console, PA-C  Follow up ***

## 2024-09-15 ENCOUNTER — Ambulatory Visit: Admitting: Physician Assistant

## 2024-09-15 ENCOUNTER — Encounter: Payer: Self-pay | Admitting: Physician Assistant

## 2024-09-15 ENCOUNTER — Other Ambulatory Visit

## 2024-09-15 VITALS — BP 122/68 | HR 61 | Ht 71.0 in | Wt 293.0 lb

## 2024-09-15 DIAGNOSIS — K7030 Alcoholic cirrhosis of liver without ascites: Secondary | ICD-10-CM | POA: Diagnosis not present

## 2024-09-15 DIAGNOSIS — I8500 Esophageal varices without bleeding: Secondary | ICD-10-CM | POA: Diagnosis not present

## 2024-09-15 DIAGNOSIS — K7682 Hepatic encephalopathy: Secondary | ICD-10-CM

## 2024-09-15 DIAGNOSIS — R110 Nausea: Secondary | ICD-10-CM | POA: Diagnosis not present

## 2024-09-15 DIAGNOSIS — R1013 Epigastric pain: Secondary | ICD-10-CM

## 2024-09-15 DIAGNOSIS — R1032 Left lower quadrant pain: Secondary | ICD-10-CM

## 2024-09-15 LAB — CBC WITH DIFFERENTIAL/PLATELET
Basophils Absolute: 0.1 K/uL (ref 0.0–0.1)
Basophils Relative: 1.1 % (ref 0.0–3.0)
Eosinophils Absolute: 0.1 K/uL (ref 0.0–0.7)
Eosinophils Relative: 2.4 % (ref 0.0–5.0)
HCT: 42.7 % (ref 39.0–52.0)
Hemoglobin: 15 g/dL (ref 13.0–17.0)
Lymphocytes Relative: 23.9 % (ref 12.0–46.0)
Lymphs Abs: 1.3 K/uL (ref 0.7–4.0)
MCHC: 35.1 g/dL (ref 30.0–36.0)
MCV: 91.3 fl (ref 78.0–100.0)
Monocytes Absolute: 0.6 K/uL (ref 0.1–1.0)
Monocytes Relative: 11.3 % (ref 3.0–12.0)
Neutro Abs: 3.2 K/uL (ref 1.4–7.7)
Neutrophils Relative %: 61.3 % (ref 43.0–77.0)
Platelets: 97 K/uL — ABNORMAL LOW (ref 150.0–400.0)
RBC: 4.67 Mil/uL (ref 4.22–5.81)
RDW: 14 % (ref 11.5–15.5)
WBC: 5.3 K/uL (ref 4.0–10.5)

## 2024-09-15 LAB — COMPREHENSIVE METABOLIC PANEL WITH GFR
ALT: 20 U/L (ref 0–53)
AST: 35 U/L (ref 0–37)
Albumin: 4 g/dL (ref 3.5–5.2)
Alkaline Phosphatase: 124 U/L — ABNORMAL HIGH (ref 39–117)
BUN: 8 mg/dL (ref 6–23)
CO2: 28 meq/L (ref 19–32)
Calcium: 8.6 mg/dL (ref 8.4–10.5)
Chloride: 104 meq/L (ref 96–112)
Creatinine, Ser: 0.72 mg/dL (ref 0.40–1.50)
GFR: 100.84 mL/min (ref >60.00)
Glucose, Bld: 93 mg/dL (ref 70–99)
Potassium: 4.1 meq/L (ref 3.5–5.1)
Sodium: 140 meq/L (ref 135–145)
Total Bilirubin: 1.8 mg/dL — ABNORMAL HIGH (ref 0.2–1.2)
Total Protein: 6.8 g/dL (ref 6.0–8.3)

## 2024-09-15 LAB — LIPASE: Lipase: 46 U/L (ref 11.0–59.0)

## 2024-09-15 LAB — PROTIME-INR
INR: 1.3 ratio — ABNORMAL HIGH (ref 0.8–1.0)
Prothrombin Time: 13.7 s — ABNORMAL HIGH (ref 9.6–13.1)

## 2024-09-15 MED ORDER — PANTOPRAZOLE SODIUM 40 MG PO TBEC: 40.0000 mg | DELAYED_RELEASE_TABLET | Freq: Every day | ORAL | 3 refills | Status: AC

## 2024-09-15 MED ORDER — LACTULOSE 10 GM/15ML PO SOLN: 30.0000 g | Freq: Three times a day (TID) | ORAL | 5 refills | Status: AC

## 2024-09-15 MED ORDER — NADOLOL 20 MG PO TABS: 20.0000 mg | ORAL_TABLET | Freq: Every day | ORAL | 5 refills | Status: AC

## 2024-09-15 MED ORDER — NA SULFATE-K SULFATE-MG SULF 17.5-3.13-1.6 GM/177ML PO SOLN: 1.0000 | Freq: Once | ORAL | 0 refills | Status: AC

## 2024-09-15 MED ORDER — FUROSEMIDE 40 MG PO TABS: 40.0000 mg | ORAL_TABLET | Freq: Two times a day (BID) | ORAL | 5 refills | Status: AC

## 2024-09-15 MED ORDER — AMILORIDE HCL 5 MG PO TABS: 10.0000 mg | ORAL_TABLET | Freq: Two times a day (BID) | ORAL | 5 refills | Status: AC

## 2024-09-15 NOTE — Progress Notes (Signed)
 Agree with assessment and plan as outlined. Tina sounds like he is due for a colonoscopy. He is already on a beta blocker so would not need an EGD to survey varices, however if you think it is needed to evaluate his abdominal pain we can proceed with it. Thanks

## 2024-09-15 NOTE — Patient Instructions (Addendum)
 Your provider has requested that you go to the basement level for lab work before leaving today. Press B on the elevator. The lab is located at the first door on the left as you exit the elevator.  You have been scheduled for a CT scan of the abdomen and pelvis at [facility], 1st floor Radiology. You are scheduled on 09/28/24 at 11:30 am. You should arrive 15 minutes prior to your appointment time for registration. If you have any questions regarding your exam or if you need to reschedule, you may call [facility] at [phone] between the hours of 8:00 am and 5:00 pm, Monday-Friday.   You have been scheduled for an Endoscopy and Colonoscopy. Please follow the written instructions given to you at your visit today.  If you use inhalers (even only as needed), please bring them with you on the day of your procedure.  DO NOT TAKE 7 DAYS PRIOR TO TEST- Trulicity (dulaglutide) Ozempic, Wegovy (semaglutide) Mounjaro (tirzepatide) Bydureon Bcise (exanatide extended release)  DO NOT TAKE 1 DAY PRIOR TO YOUR TEST Rybelsus (semaglutide) Adlyxin (lixisenatide) Victoza (liraglutide) Byetta (exanatide) ___________________________________________________________________________  Please follow up sooner if symptoms increase or worsen __________________________________________________________________________  Due to recent changes in healthcare laws, you may see the results of your imaging and laboratory studies on MyChart before your provider has had a chance to review them.  We understand that in some cases there may be results that are confusing or concerning to you. Not all laboratory results come back in the same time frame and the provider may be waiting for multiple results in order to interpret others.  Please give us  48 hours in order for your provider to thoroughly review all the results before contacting the office for clarification of your results.   Thank you for  trusting me with your gastrointestinal care!   Ellouise Console, PA-C _______________________________________________________  If your blood pressure at your visit was 140/90 or greater, please contact your primary care physician to follow up on this.  _______________________________________________________  If you are age 56 or older, your body mass index should be between 23-30. Your Body mass index is 40.87 kg/m. If this is out of the aforementioned range listed, please consider follow up with your Primary Care Provider.  If you are age 30 or younger, your body mass index should be between 19-25. Your Body mass index is 40.87 kg/m. If this is out of the aformentioned range listed, please consider follow up with your Primary Care Provider.   ________________________________________________________  The [facility] providers would like to encourage you to use MYCHART to communicate with providers for non-urgent requests or questions.  Due to long hold times on the telephone, sending your provider a message by [facility] may be a faster and more efficient way to get a response.  Please allow 48 business hours for a response.  Please remember that this is for non-urgent requests.  _______________________________________________________

## 2024-09-16 ENCOUNTER — Ambulatory Visit: Payer: Self-pay | Admitting: Physician Assistant

## 2024-09-17 LAB — AFP TUMOR MARKER: AFP-Tumor Marker: 3 ng/mL (ref <6.1)

## 2024-09-28 ENCOUNTER — Ambulatory Visit (HOSPITAL_COMMUNITY)
Admission: RE | Admit: 2024-09-28 | Discharge: 2024-09-28 | Attending: Physician Assistant | Admitting: Physician Assistant

## 2024-09-28 DIAGNOSIS — R1032 Left lower quadrant pain: Secondary | ICD-10-CM | POA: Diagnosis not present

## 2024-09-28 DIAGNOSIS — R1085 Abdominal pain of multiple sites: Secondary | ICD-10-CM | POA: Diagnosis not present

## 2024-09-28 DIAGNOSIS — K7030 Alcoholic cirrhosis of liver without ascites: Secondary | ICD-10-CM | POA: Diagnosis not present

## 2024-09-28 DIAGNOSIS — R1013 Epigastric pain: Secondary | ICD-10-CM | POA: Insufficient documentation

## 2024-09-28 DIAGNOSIS — K7469 Other cirrhosis of liver: Secondary | ICD-10-CM | POA: Diagnosis not present

## 2024-09-28 DIAGNOSIS — R161 Splenomegaly, not elsewhere classified: Secondary | ICD-10-CM | POA: Diagnosis not present

## 2024-09-28 DIAGNOSIS — K766 Portal hypertension: Secondary | ICD-10-CM | POA: Diagnosis not present

## 2024-09-28 MED ORDER — IOHEXOL 300 MG/ML  SOLN
100.0000 mL | Freq: Once | INTRAMUSCULAR | Status: AC | PRN
  Administered 2024-09-28: 11:00:00 100 mL via INTRAVENOUS

## 2024-11-03 ENCOUNTER — Ambulatory Visit: Admitting: Gastroenterology

## 2024-11-03 ENCOUNTER — Encounter: Payer: Self-pay | Admitting: Gastroenterology

## 2024-11-03 VITALS — BP 123/74 | HR 67 | Temp 97.9°F | Resp 16 | Ht 71.0 in | Wt 293.0 lb

## 2024-11-03 DIAGNOSIS — K449 Diaphragmatic hernia without obstruction or gangrene: Secondary | ICD-10-CM

## 2024-11-03 DIAGNOSIS — I868 Varicose veins of other specified sites: Secondary | ICD-10-CM

## 2024-11-03 DIAGNOSIS — K2980 Duodenitis without bleeding: Secondary | ICD-10-CM | POA: Diagnosis not present

## 2024-11-03 DIAGNOSIS — Z860100 Personal history of colon polyps, unspecified: Secondary | ICD-10-CM

## 2024-11-03 DIAGNOSIS — D123 Benign neoplasm of transverse colon: Secondary | ICD-10-CM | POA: Diagnosis not present

## 2024-11-03 DIAGNOSIS — K3189 Other diseases of stomach and duodenum: Secondary | ICD-10-CM | POA: Diagnosis not present

## 2024-11-03 DIAGNOSIS — I8500 Esophageal varices without bleeding: Secondary | ICD-10-CM

## 2024-11-03 DIAGNOSIS — K648 Other hemorrhoids: Secondary | ICD-10-CM | POA: Diagnosis not present

## 2024-11-03 DIAGNOSIS — D122 Benign neoplasm of ascending colon: Secondary | ICD-10-CM

## 2024-11-03 DIAGNOSIS — R1013 Epigastric pain: Secondary | ICD-10-CM

## 2024-11-03 DIAGNOSIS — Z1211 Encounter for screening for malignant neoplasm of colon: Secondary | ICD-10-CM | POA: Diagnosis present

## 2024-11-03 MED ORDER — SODIUM CHLORIDE 0.9 % IV SOLN: 500.0000 mL | Freq: Once | INTRAVENOUS | Status: DC

## 2024-11-03 NOTE — Progress Notes (Signed)
 [facility] History and Physical   Primary Care Physician:  [name], MD   Reason for Procedure:   Epigastric pain (negative CT), history of varices, history of colon polyps  Plan:    EGD and colonoscopy     HPI: Adam Hartman is a 59 y.o. male  here for EGD and colonoscopy - history of cirrhosis, postprandial epigastric pain, negative CT. EGD to evaluate, also history of esophageal varices on nadolol  for this. Last colonoscopy 2018 with polyps removed, done in [place], overdue for surveillance. Started on protonix  40mg  / day.   Patient denies any bowel symptoms at this time. No family history of colon cancer known. Otherwise feels well without any cardiopulmonary symptoms.   I have discussed risks / benefits of anesthesia and endoscopic procedure with Adam Hartman and they wish to proceed with the exams as outlined today.   The patient was provided an opportunity to ask questions and all were answered. The patient agreed with the plan.    Past Medical History:  Diagnosis Date   Alcoholism (HCC) 06/06/2012   Anxiety    panic attacks   Ascites 02/08/2021   Cirrhosis (HCC) 02/08/2021   Colon polyps    Depression    GERD (gastroesophageal reflux disease)    02/08/21   Hypertension     Past Surgical History:  Procedure Laterality Date   colonoscopy  12/2016   ESOPHAGOGASTRODUODENOSCOPY (EGD) WITH PROPOFOL  N/A 02/21/2021   Procedure: ESOPHAGOGASTRODUODENOSCOPY (EGD) WITH PROPOFOL ;  Surgeon: Shila Gustav GAILS, MD;  Location: [facility];  Service: Endoscopy;  Laterality: N/A;   RADIOLOGY WITH ANESTHESIA N/A 08/16/2021   Procedure: MRI LIVER WITH AND WITHOUT CONTRAST;  Surgeon: Radiologist, Medication, MD;  Location: [facility] OR;  Service: Radiology;  Laterality: N/A;   WISDOM TOOTH EXTRACTION      Prior to Admission medications  Medication Sig Start Date End Date Taking? Authorizing Provider  aMILoride  (MIDAMOR ) 5 MG tablet Take 2 tablets (10 mg total)  by mouth 2 (two) times daily. 09/15/24  Yes [place], PA-C  FLUoxetine (PROZAC) 20 MG capsule Take 20 mg by mouth daily. 12/23/23  Yes [provider]  furosemide  (LASIX ) 40 MG tablet Take 1 tablet (40 mg total) by mouth 2 (two) times daily. 09/15/24  Yes [place], PA-C  lactulose  (CONSTULOSE ) 10 GM/15ML solution Take 45 mLs (30 g total) by mouth 3 (three) times daily. 09/15/24  Yes [place], PA-C  metFORMIN (GLUCOPHAGE) 500 MG tablet Take by mouth 2 (two) times daily with a meal.   Yes [provider]  multivitamin (PROSIGHT) TABS tablet Take 1 tablet by mouth daily. 02/22/21  Yes Sebastian Toribio GAILS, MD  nadolol  (CORGARD ) 20 MG tablet Take 1 tablet (20 mg total) by mouth daily. Please keep your upcoming appointment for further refills. Thank you 09/15/24  Yes [place], PA-C  oxyCODONE  (OXY IR/ROXICODONE ) 5 MG immediate release tablet Take 5 mg by mouth in the morning and at bedtime.   Yes [provider]  pantoprazole  (PROTONIX ) 40 MG tablet Take 1 tablet (40 mg total) by mouth daily. 09/15/24  Yes [place], PA-C    Current Outpatient Medications  Medication Sig Dispense Refill   aMILoride  (MIDAMOR ) 5 MG tablet Take 2 tablets (10 mg total) by mouth 2 (two) times daily. 120 tablet 5   FLUoxetine (PROZAC) 20 MG capsule Take 20 mg by mouth daily.     furosemide  (LASIX ) 40 MG tablet Take 1 tablet (40 mg total) by mouth 2 (two) times  daily. 60 tablet 5   lactulose  (CONSTULOSE ) 10 GM/15ML solution Take 45 mLs (30 g total) by mouth 3 (three) times daily. 4050 mL 5   metFORMIN (GLUCOPHAGE) 500 MG tablet Take by mouth 2 (two) times daily with a meal.     multivitamin (PROSIGHT) TABS tablet Take 1 tablet by mouth daily. 30 tablet 0   nadolol  (CORGARD ) 20 MG tablet Take 1 tablet (20 mg total) by mouth daily. Please keep your upcoming appointment for further refills. Thank you 30 tablet 5   oxyCODONE  (OXY IR/ROXICODONE ) 5 MG immediate release tablet Take 5 mg by  mouth in the morning and at bedtime.     pantoprazole  (PROTONIX ) 40 MG tablet Take 1 tablet (40 mg total) by mouth daily. 90 tablet 3   Current Facility-Administered Medications  Medication Dose Route Frequency Provider Last Rate Last Admin   0.9 %  sodium chloride  infusion  500 mL Intravenous Once Yunior Jain, Elspeth SQUIBB, MD        Allergies as of 11/03/2024 - Review Complete 11/03/2024  Allergen Reaction Noted   Codeine Itching 06/05/2012   Atenolol  Other (See Comments) 02/08/2021   Wellbutrin [bupropion] Other (See Comments) 02/08/2021    Family History  Problem Relation Age of Onset   Skin cancer Mother    Peripheral vascular disease Father    Stomach cancer Father    Congenital heart disease Maternal Grandmother    Heart attack Maternal Grandmother    CAD Maternal Grandmother    Dementia Maternal Grandfather    CAD Maternal Grandfather    Heart attack Maternal Grandfather    Skin cancer Maternal Grandfather    Congenital heart disease Paternal Grandmother    Lung cancer Paternal Grandmother    Cancer Paternal Grandfather    COPD Paternal Grandfather    Colon cancer Neg Hx    Colon polyps Neg Hx    Rectal cancer Neg Hx    Esophageal cancer Neg Hx     Social History   Socioeconomic History   Marital status: Legally Separated    Spouse name: Not on file   Number of children: 3   Years of education: Not on file   Highest education level: Not on file  Occupational History   Occupation: counselling psychologist  Tobacco Use   Smoking status: Every Day    Current packs/day: 0.00    Average packs/day: 3.0 packs/day for 15.0 years (45.0 ttl pk-yrs)    Types: Cigarettes    Start date: 11/24/2005    Last attempt to quit: 11/24/2020    Years since quitting: 3.9   Smokeless tobacco: Never  Vaping Use   Vaping status: Never Used  Substance and Sexual Activity   Alcohol  use: Not Currently    Alcohol /week: 18.0 - 19.0 standard drinks of alcohol     Types: 18 - 19 Cans of beer per  week    Comment: 01/15/2021   Drug use: No   Sexual activity: Yes  Other Topics Concern   Not on file  Social History Narrative   Not on file   Social Drivers of Health   Tobacco Use: High Risk (11/03/2024)   Patient History    Smoking Tobacco Use: Every Day    Smokeless Tobacco Use: Never    Passive Exposure: Not on file  Financial Resource Strain: Not on file  Food Insecurity: Not on file  Transportation Needs: Not on file  Physical Activity: Not on file  Stress: Not on file  Social Connections: Not on file  Intimate Partner Violence: Not on file  Depression (PHQ2-9): Not on file  Alcohol  Screen: Not on file  Housing: Not on file  Utilities: Not on file  Health Literacy: Not on file    Review of Systems: All other review of systems negative except as mentioned in the HPI.  Physical Exam: Vital signs BP 130/77   Pulse 63   Temp 97.9 F (36.6 C) (Skin)   Ht 5' 11 (1.803 m)   Wt 293 lb (132.9 kg)   SpO2 98%   BMI 40.87 kg/m   General:   Alert,  Well-developed, pleasant and cooperative in NAD Lungs:  Clear throughout to auscultation.   Heart:  Regular rate and rhythm Abdomen:  Soft, nontender and nondistended.   Neuro/Psych:  Alert and cooperative. Normal mood and affect. A and O x 3  Marcey Naval, MD [facility]

## 2024-11-03 NOTE — Progress Notes (Signed)
 Pt states he took two large sips of water at 0945 and smoked cigarettes before procedure. CRNA notified.

## 2024-11-03 NOTE — Op Note (Signed)
 [facility] Patient Name: Adam Hartman Procedure Date: 11/03/2024 11:35 AM MRN: [id number] Endoscopist: Elspeth P. Leigh , MD, [id number] Age: 59 Referring MD:  Date of Birth: 07-13-1966 Gender: Male Account #: [id number] Procedure:                Colonoscopy Indications:              High risk colon cancer surveillance: Personal                            history of colonic polyps - last exam 2018 - polyps                            removed (done at [facility], no details) Medicines:                Monitored Anesthesia Care Procedure:                Pre-Anesthesia Assessment:                           - Prior to the procedure, a History and Physical                            was performed, and patient medications and                            allergies were reviewed. The patient's tolerance of                            previous anesthesia was also reviewed. The risks                            and benefits of the procedure and the sedation                            options and risks were discussed with the patient.                            All questions were answered, and informed consent                            was obtained. Prior Anticoagulants: The patient has                            taken no anticoagulant or antiplatelet agents. ASA                            Grade Assessment: III - A patient with severe                            systemic disease. After reviewing the risks and                            benefits, the patient was deemed in satisfactory  condition to undergo the procedure.                           After obtaining informed consent, the colonoscope                            was passed under direct vision. Throughout the                            procedure, the patient's blood pressure, pulse, and                            oxygen saturations were monitored continuously. The                            Olympus Scope  SN [id number] was introduced through the                            anus and advanced to the the cecum, identified by                            appendiceal orifice and ileocecal valve. The                            colonoscopy was performed without difficulty. The                            patient tolerated the procedure well. The quality                            of the bowel preparation was adequate. The                            ileocecal valve, appendiceal orifice, and rectum                            were photographed. Scope In: 12:02:31 PM Scope Out: 12:25:51 PM Scope Withdrawal Time: 0 hours 16 minutes 47 seconds  Total Procedure Duration: 0 hours 23 minutes 20 seconds  Findings:                 The perianal and digital rectal examinations were                            normal.                           A 3 mm polyp was found in the ascending colon. The                            polyp was sessile. The polyp was removed with a                            cold snare. Resection and retrieval were complete.  Three sessile polyps were found in the transverse                            colon. The polyps were 4 to 7 mm in size. These                            polyps were removed with a cold snare. Resection                            and retrieval were complete.                           Rectal varices were found.                           Internal hemorrhoids were found during retroflexion.                           The right colon was congested due to portal HTN.                            The exam was otherwise without abnormality. Complications:            No immediate complications. Estimated blood loss:                            Minimal. Estimated Blood Loss:     Estimated blood loss was minimal. Impression:               - One 3 mm polyp in the ascending colon, removed                            with a cold snare. Resected and retrieved.                            - Three 4 to 7 mm polyps in the transverse colon,                            removed with a cold snare. Resected and retrieved.                           - Rectal varices.                           - Internal hemorrhoids.                           - The examination was otherwise normal. Recommendation:           - Patient has a contact number available for                            emergencies. The signs and symptoms of potential  delayed complications were discussed with the                            patient. Return to normal activities tomorrow.                            Written discharge instructions were provided to the                            patient.                           - Resume previous diet.                           - Continue present medications.                           - Await pathology results. Elspeth P. Prynce Jacober, MD 11/03/2024 12:35:06 PM This report has been signed electronically.

## 2024-11-03 NOTE — Patient Instructions (Signed)
Discharge instructions given. Handouts on polyps ,Hemorrhoids and Hiatal Hernia. Resume previous medications. YOU HAD AN ENDOSCOPIC PROCEDURE TODAY AT THE [facility]:   Refer to the procedure report that was given to you for any specific questions about what was found during the examination.  If the procedure report does not answer your questions, please call your gastroenterologist to clarify.  If you requested that your care partner not be given the details of your procedure findings, then the procedure report has been included in a sealed envelope for you to review at your convenience later.  YOU SHOULD EXPECT: Some feelings of bloating in the abdomen. Passage of more gas than usual.  Walking can help get rid of the air that was put into your GI tract during the procedure and reduce the bloating. If you had a lower endoscopy (such as a colonoscopy or flexible sigmoidoscopy) you may notice spotting of blood in your stool or on the toilet paper. If you underwent a bowel prep for your procedure, you may not have a normal bowel movement for a few days.  Please Note:  You might notice some irritation and congestion in your nose or some drainage.  This is from the oxygen used during your procedure.  There is no need for concern and it should clear up in a day or so.  SYMPTOMS TO REPORT IMMEDIATELY:  Following lower endoscopy (colonoscopy or flexible sigmoidoscopy):  Excessive amounts of blood in the stool  Significant tenderness or worsening of abdominal pains  Swelling of the abdomen that is new, acute  Fever of 100F or higher  Following upper endoscopy (EGD)  Vomiting of blood or coffee ground material  New chest pain or pain under the shoulder blades  Painful or persistently difficult swallowing  New shortness of breath  Fever of 100F or higher  Black, tarry-looking stools  For urgent or emergent issues, a gastroenterologist can be reached at any hour by calling [phone]  [phone]. Do not use MyChart messaging for urgent concerns.    DIET:  We do recommend a small meal at first, but then you may proceed to your regular diet.  Drink plenty of fluids but you should avoid alcoholic beverages for 24 hours.  ACTIVITY:  You should plan to take it easy for the rest of today and you should NOT DRIVE or use heavy machinery until tomorrow (because of the sedation medicines used during the test).    FOLLOW UP: Our staff will call the number listed on your records the next business day following your procedure.  We will call around 7:15- 8:00 am to check on you and address any questions or concerns that you may have regarding the information given to you following your procedure. If we do not reach you, we will leave a message.     If any biopsies were taken you will be contacted by phone or by letter within the next 1-3 weeks.  Please call us at [phone] if you have not heard about the biopsies in 3 weeks.    SIGNATURES/CONFIDENTIALITY: You and/or your care partner have signed paperwork which will be entered into your electronic medical record.  These signatures attest to the fact that that the information above on your After Visit Summary has been reviewed and is understood.  Full responsibility of the confidentiality of this discharge information lies with you and/or your care-partner.

## 2024-11-03 NOTE — Op Note (Signed)
 [facility] Patient Name: Adam Hartman Procedure Date: 11/03/2024 11:36 AM MRN: [id number] Endoscopist: Elspeth P. Leigh , MD, [id number] Age: 59 Referring MD:  Date of Birth: 1966-09-20 Gender: Male Account #: [id number] Procedure:                Upper GI endoscopy Indications:              Epigastric abdominal pain - somewhat improved with                            protonix . Negative CT scan. HIstory of cirrhosis                            with varices - on nadolol , resting HR around 60 Medicines:                Monitored Anesthesia Care Procedure:                Pre-Anesthesia Assessment:                           - Prior to the procedure, a History and Physical                            was performed, and patient medications and                            allergies were reviewed. The patient's tolerance of                            previous anesthesia was also reviewed. The risks                            and benefits of the procedure and the sedation                            options and risks were discussed with the patient.                            All questions were answered, and informed consent                            was obtained. Prior Anticoagulants: The patient has                            taken no anticoagulant or antiplatelet agents. ASA                            Grade Assessment: III - A patient with severe                            systemic disease. After reviewing the risks and                            benefits, the patient was deemed in satisfactory  condition to undergo the procedure.                           After obtaining informed consent, the endoscope was                            passed under direct vision. Throughout the                            procedure, the patient's blood pressure, pulse, and                            oxygen saturations were monitored continuously. The                             GIF [id number] #[id number] was introduced through the                            mouth, and advanced to the second part of duodenum.                            The upper GI endoscopy was accomplished without                            difficulty. The patient tolerated the procedure                            well. Scope In: Scope Out: Findings:                 Esophagogastric landmarks were identified: the                            Z-line was found at 40 cm, the gastroesophageal                            junction was found at 40 cm and the upper extent of                            the gastric folds was found at 42 cm from the                            incisors.                           A 2 cm hiatal hernia was present.                           Varices were found in the lower third of the                            esophagus. They were medium to large in size                            without high  risk stigmata for bleeding.                           The exam of the esophagus was otherwise normal.                           Mild portal hypertensive gastropathy was found in                            the entire examined stomach. Biopsies were taken                            with a cold forceps for Helicobacter pylori                            testing. The stomach was otherwise normal.                           Diffuse nodular mucosa was found in the duodenal                            bulb, I suspect benign peptic duodenitis. Biopsies                            were taken with a cold forceps for histology to                            ensure no adenoma.                           The exam of the duodenum was otherwise normal. Complications:            No immediate complications. Estimated blood loss:                            Minimal. Estimated Blood Loss:     Estimated blood loss was minimal. Impression:               - Esophagogastric landmarks identified.                            - 2 cm hiatal hernia.                           - Esophageal varices as outlined - on nadolol .                           - Portal hypertensive gastropathy. Biopsied.                           - Normal stomach otherwise.                           - Nodular mucosa in the duodenal bulb, suspect  benign peptic duodenitis. Biopsied. Recommendation:           - Patient has a contact number available for                            emergencies. The signs and symptoms of potential                            delayed complications were discussed with the                            patient. Return to normal activities tomorrow.                            Written discharge instructions were provided to the                            patient.                           - Resume previous diet.                           - Continue present medications.                           - Await pathology results. [name], MD 11/03/2024 12:01:27 PM This report has been signed electronically.

## 2024-11-03 NOTE — Progress Notes (Signed)
 Sedate, gd SR, tolerated procedure well, VSS, report to RN

## 2024-11-03 NOTE — Progress Notes (Signed)
 Called to room to assist during endoscopic procedure.  Patient ID and intended procedure confirmed with present staff. Received instructions for my participation in the procedure from the performing physician.

## 2024-11-04 ENCOUNTER — Telehealth: Payer: Self-pay

## 2024-11-04 NOTE — Telephone Encounter (Signed)
 No answer on follow up call - voice mail message left

## 2024-11-05 ENCOUNTER — Ambulatory Visit: Payer: Self-pay | Admitting: Gastroenterology

## 2024-11-05 LAB — SURGICAL PATHOLOGY
